# Patient Record
Sex: Male | Born: 1955 | Race: White | Hispanic: No | Marital: Married | Smoking: Never smoker
Health system: Southern US, Community
[De-identification: ages and names within clinical notes are randomized; demographics above are authoritative.]

## PROBLEM LIST (undated history)

## (undated) DIAGNOSIS — C801 Malignant (primary) neoplasm, unspecified: Secondary | ICD-10-CM

## (undated) HISTORY — PX: PROSTATE SURGERY: SHX751

## (undated) HISTORY — PX: JOINT REPLACEMENT: SHX530

---

## 2006-05-08 ENCOUNTER — Ambulatory Visit: Payer: Self-pay | Admitting: Gastroenterology

## 2007-03-18 DIAGNOSIS — C4491 Basal cell carcinoma of skin, unspecified: Secondary | ICD-10-CM

## 2007-03-18 HISTORY — DX: Basal cell carcinoma of skin, unspecified: C44.91

## 2008-02-04 DIAGNOSIS — D239 Other benign neoplasm of skin, unspecified: Secondary | ICD-10-CM

## 2008-02-04 HISTORY — DX: Other benign neoplasm of skin, unspecified: D23.9

## 2008-03-03 ENCOUNTER — Ambulatory Visit: Payer: Self-pay | Admitting: Internal Medicine

## 2009-05-12 ENCOUNTER — Ambulatory Visit: Payer: Self-pay | Admitting: Internal Medicine

## 2011-07-10 ENCOUNTER — Ambulatory Visit: Payer: Self-pay | Admitting: Specialist

## 2011-07-10 LAB — BASIC METABOLIC PANEL
BUN: 10 mg/dL (ref 7–18)
Chloride: 108 mmol/L — ABNORMAL HIGH (ref 98–107)
EGFR (African American): >60
EGFR (Non-African Amer.): >60
Osmolality: 280 (ref 275–301)

## 2011-07-10 LAB — URINALYSIS, COMPLETE
Bacteria: NONE SEEN
Bilirubin,UR: NEGATIVE
Glucose,UR: NEGATIVE mg/dL (ref 0–75)
Ketone: NEGATIVE
Leukocyte Esterase: NEGATIVE
Ph: 7 (ref 4.5–8.0)
Protein: NEGATIVE
Specific Gravity: 1.005 (ref 1.003–1.030)
Squamous Epithelial: NONE SEEN
WBC UR: NONE SEEN /HPF (ref 0–5)

## 2011-07-10 LAB — CBC
HCT: 41.3 % (ref 40.0–52.0)
MCHC: 32.9 g/dL (ref 32.0–36.0)
Platelet: 369 10*3/uL (ref 150–440)
RBC: 4.37 10*6/uL — ABNORMAL LOW (ref 4.40–5.90)
RDW: 12.6 % (ref 11.5–14.5)
WBC: 7.4 10*3/uL (ref 3.8–10.6)

## 2011-07-10 LAB — MRSA PCR SCREENING

## 2011-07-10 LAB — PROTIME-INR: INR: 1

## 2011-07-17 ENCOUNTER — Ambulatory Visit: Payer: Self-pay | Admitting: General Surgery

## 2011-07-18 ENCOUNTER — Ambulatory Visit: Payer: Self-pay | Admitting: General Surgery

## 2011-07-23 LAB — WOUND CULTURE

## 2011-10-02 ENCOUNTER — Ambulatory Visit: Payer: Self-pay | Admitting: Specialist

## 2011-10-02 LAB — BASIC METABOLIC PANEL
BUN: 11 mg/dL (ref 7–18)
Chloride: 109 mmol/L — ABNORMAL HIGH (ref 98–107)
Creatinine: 0.83 mg/dL (ref 0.60–1.30)
EGFR (African American): >60
EGFR (Non-African Amer.): >60
Glucose: 70 mg/dL (ref 65–99)
Sodium: 143 mmol/L (ref 136–145)

## 2011-10-02 LAB — URINALYSIS, COMPLETE
Bacteria: NONE SEEN
Bilirubin,UR: NEGATIVE
Blood: NEGATIVE
Glucose,UR: NEGATIVE mg/dL (ref 0–75)
Ketone: NEGATIVE
Leukocyte Esterase: NEGATIVE
Nitrite: NEGATIVE
Ph: 5 (ref 4.5–8.0)
Protein: NEGATIVE
RBC,UR: <1 /HPF (ref 0–5)
Specific Gravity: 1.019 (ref 1.003–1.030)
Squamous Epithelial: NONE SEEN
WBC UR: 1 /HPF (ref 0–5)

## 2011-10-02 LAB — APTT: Activated PTT: 29.4 secs (ref 23.6–35.9)

## 2011-10-02 LAB — CBC
HCT: 39 % — ABNORMAL LOW (ref 40.0–52.0)
HGB: 13.1 g/dL (ref 13.0–18.0)
MCH: 30.9 pg (ref 26.0–34.0)
MCHC: 33.5 g/dL (ref 32.0–36.0)
MCV: 92 fL (ref 80–100)
Platelet: 296 10*3/uL (ref 150–440)
RBC: 4.23 10*6/uL — ABNORMAL LOW (ref 4.40–5.90)
RDW: 13.9 % (ref 11.5–14.5)
WBC: 5.4 10*3/uL (ref 3.8–10.6)

## 2011-10-02 LAB — PROTIME-INR
INR: 0.9
Prothrombin Time: 13 secs (ref 11.5–14.7)

## 2011-10-02 LAB — MRSA PCR SCREENING

## 2011-10-10 ENCOUNTER — Inpatient Hospital Stay: Payer: Self-pay | Admitting: Specialist

## 2011-10-10 LAB — TROPONIN I: Troponin-I: <0.02 ng/mL

## 2011-10-10 LAB — MAGNESIUM: Magnesium: 1.8 mg/dL

## 2011-10-10 LAB — BASIC METABOLIC PANEL
BUN: 10 mg/dL (ref 7–18)
Calcium, Total: 7.8 mg/dL — ABNORMAL LOW (ref 8.5–10.1)
Chloride: 108 mmol/L — ABNORMAL HIGH (ref 98–107)
Co2: 28 mmol/L (ref 21–32)
EGFR (Non-African Amer.): >60
Glucose: 213 mg/dL — ABNORMAL HIGH (ref 65–99)
Osmolality: 287 (ref 275–301)
Potassium: 3.9 mmol/L (ref 3.5–5.1)
Sodium: 141 mmol/L (ref 136–145)

## 2011-10-10 LAB — CBC WITH DIFFERENTIAL/PLATELET
Basophil %: 0.4 %
Eosinophil #: 0 10*3/uL (ref 0.0–0.7)
Eosinophil %: 0.2 %
HCT: 33.7 % — ABNORMAL LOW (ref 40.0–52.0)
HGB: 11.5 g/dL — ABNORMAL LOW (ref 13.0–18.0)
Lymphocyte #: 0.9 10*3/uL — ABNORMAL LOW (ref 1.0–3.6)
MCH: 31.3 pg (ref 26.0–34.0)
MCHC: 34.1 g/dL (ref 32.0–36.0)
Monocyte #: 0.5 x10 3/mm (ref 0.2–1.0)
Neutrophil #: 13.6 10*3/uL — ABNORMAL HIGH (ref 1.4–6.5)
RBC: 3.66 10*6/uL — ABNORMAL LOW (ref 4.40–5.90)
WBC: 15.1 10*3/uL — ABNORMAL HIGH (ref 3.8–10.6)

## 2011-10-10 LAB — CK TOTAL AND CKMB (NOT AT ARMC)
CK, Total: 2408 U/L — ABNORMAL HIGH (ref 35–232)
CK-MB: 50.2 ng/mL — ABNORMAL HIGH (ref 0.5–3.6)
CK-MB: 32.7 ng/mL — ABNORMAL HIGH (ref 0.5–3.6)

## 2011-10-11 LAB — CBC WITH DIFFERENTIAL/PLATELET
Basophil #: 0 10*3/uL (ref 0.0–0.1)
Basophil %: 0.4 %
Eosinophil %: 0.4 %
HCT: 32.9 % — ABNORMAL LOW (ref 40.0–52.0)
HGB: 11.5 g/dL — ABNORMAL LOW (ref 13.0–18.0)
Lymphocyte #: 0.6 10*3/uL — ABNORMAL LOW (ref 1.0–3.6)
MCV: 91 fL (ref 80–100)
Monocyte #: 0.7 x10 3/mm (ref 0.2–1.0)
Monocyte %: 6.8 %
Neutrophil #: 9 10*3/uL — ABNORMAL HIGH (ref 1.4–6.5)
RDW: 13.8 % (ref 11.5–14.5)
WBC: 10.4 10*3/uL (ref 3.8–10.6)

## 2011-10-11 LAB — BASIC METABOLIC PANEL
Calcium, Total: 8.1 mg/dL — ABNORMAL LOW (ref 8.5–10.1)
Chloride: 107 mmol/L (ref 98–107)
Co2: 25 mmol/L (ref 21–32)
Creatinine: 0.63 mg/dL (ref 0.60–1.30)
EGFR (African American): >60
Glucose: 138 mg/dL — ABNORMAL HIGH (ref 65–99)
Potassium: 3.8 mmol/L (ref 3.5–5.1)
Sodium: 140 mmol/L (ref 136–145)

## 2011-10-11 LAB — CK TOTAL AND CKMB (NOT AT ARMC)
CK, Total: 1144 U/L — ABNORMAL HIGH (ref 35–232)
CK-MB: 14.1 ng/mL — ABNORMAL HIGH (ref 0.5–3.6)

## 2011-10-11 LAB — TROPONIN I: Troponin-I: <0.02 ng/mL

## 2011-10-12 LAB — BASIC METABOLIC PANEL
Anion Gap: 5 — ABNORMAL LOW (ref 7–16)
BUN: 10 mg/dL (ref 7–18)
EGFR (Non-African Amer.): >60
Glucose: 103 mg/dL — ABNORMAL HIGH (ref 65–99)
Potassium: 4.3 mmol/L (ref 3.5–5.1)
Sodium: 141 mmol/L (ref 136–145)

## 2011-10-14 LAB — BASIC METABOLIC PANEL
Anion Gap: 9 (ref 7–16)
BUN: 7 mg/dL (ref 7–18)
Calcium, Total: 8.6 mg/dL (ref 8.5–10.1)
Chloride: 110 mmol/L — ABNORMAL HIGH (ref 98–107)
Co2: 26 mmol/L (ref 21–32)
EGFR (Non-African Amer.): >60
Glucose: 88 mg/dL (ref 65–99)
Osmolality: 286 (ref 275–301)
Potassium: 4 mmol/L (ref 3.5–5.1)

## 2011-10-14 LAB — HEMOGLOBIN: HGB: 9.8 g/dL — ABNORMAL LOW (ref 13.0–18.0)

## 2012-10-08 ENCOUNTER — Ambulatory Visit: Payer: Self-pay | Admitting: Specialist

## 2012-10-08 DIAGNOSIS — R9431 Abnormal electrocardiogram [ECG] [EKG]: Secondary | ICD-10-CM

## 2012-10-08 LAB — URINALYSIS, COMPLETE
Bilirubin,UR: NEGATIVE
Glucose,UR: NEGATIVE mg/dL (ref 0–75)
Ketone: NEGATIVE
Leukocyte Esterase: NEGATIVE
Ph: 5 (ref 4.5–8.0)
Protein: NEGATIVE
RBC,UR: 2 /HPF (ref 0–5)
Specific Gravity: 1.024 (ref 1.003–1.030)
WBC UR: <1 /HPF (ref 0–5)

## 2012-10-08 LAB — MRSA PCR SCREENING

## 2012-10-08 LAB — BASIC METABOLIC PANEL
Anion Gap: 6 — ABNORMAL LOW (ref 7–16)
BUN: 15 mg/dL (ref 7–18)
Calcium, Total: 9.3 mg/dL (ref 8.5–10.1)
Chloride: 108 mmol/L — ABNORMAL HIGH (ref 98–107)
Creatinine: 0.85 mg/dL (ref 0.60–1.30)
EGFR (Non-African Amer.): >60
Osmolality: 281 (ref 275–301)
Potassium: 4.1 mmol/L (ref 3.5–5.1)

## 2012-10-08 LAB — APTT: Activated PTT: 31.4 secs (ref 23.6–35.9)

## 2012-10-08 LAB — CBC
HGB: 14.3 g/dL (ref 13.0–18.0)
MCHC: 34.8 g/dL (ref 32.0–36.0)
MCV: 91 fL (ref 80–100)
RBC: 4.53 10*6/uL (ref 4.40–5.90)

## 2012-10-08 LAB — PROTIME-INR: Prothrombin Time: 13.7 secs (ref 11.5–14.7)

## 2012-10-30 ENCOUNTER — Inpatient Hospital Stay: Payer: Self-pay | Admitting: Specialist

## 2012-10-31 LAB — CREATININE, SERUM
Creatinine: 0.89 mg/dL (ref 0.60–1.30)
EGFR (African American): >60
EGFR (Non-African Amer.): >60

## 2012-10-31 LAB — HEMOGLOBIN: HGB: 11.5 g/dL — ABNORMAL LOW (ref 13.0–18.0)

## 2012-11-01 LAB — PATHOLOGY REPORT

## 2012-12-09 ENCOUNTER — Ambulatory Visit: Payer: Self-pay | Admitting: Specialist

## 2013-10-22 IMAGING — CT CT HEAD WITHOUT CONTRAST
1 series · 16 of 30 positions shown, 20 images · non-contrast
Comparison: none

REASON FOR EXAM: possible sz
COMMENTS:

PROCEDURE:     CT  - CT HEAD WITHOUT CONTRAST  - October 10, 2011  [DATE]
RESULT:     Comparison:  None
TECHNIQUE: Multiple axial images from the foramen magnum to the vertex were
obtained without IV contrast.

[Series 2: soft tissue · axial · 0.41mm/px · z∈[+866,+1000]mm · 16 of 31 slices shown, 20 images]
[im 2/31  brain]
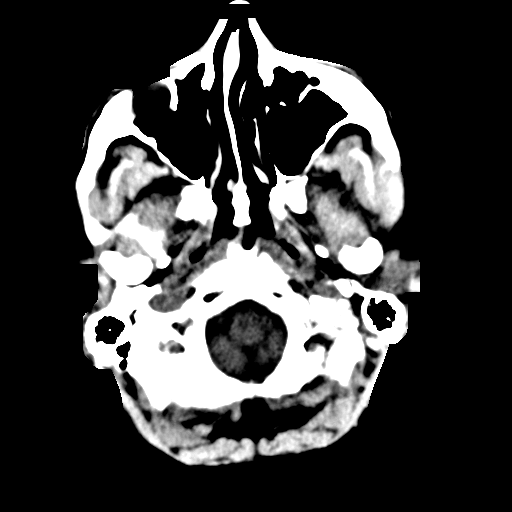
[im 2/31  bone]
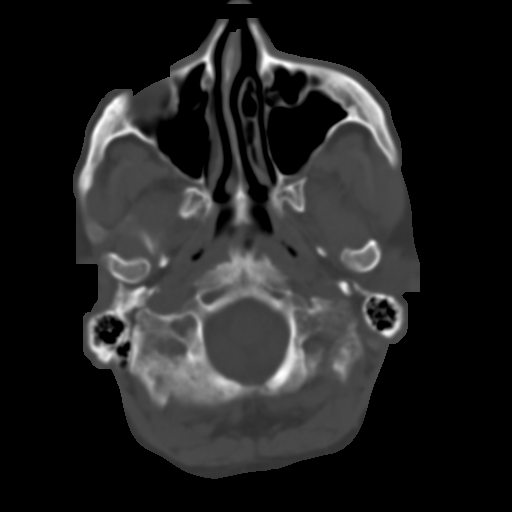
[im 4/31  brain]
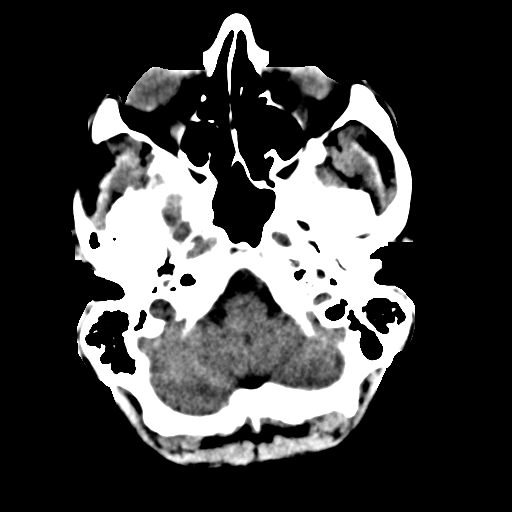
[im 6/31  brain]
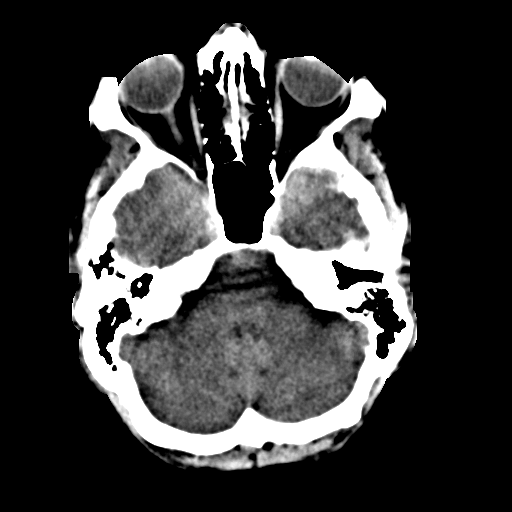
[im 8/31  brain]
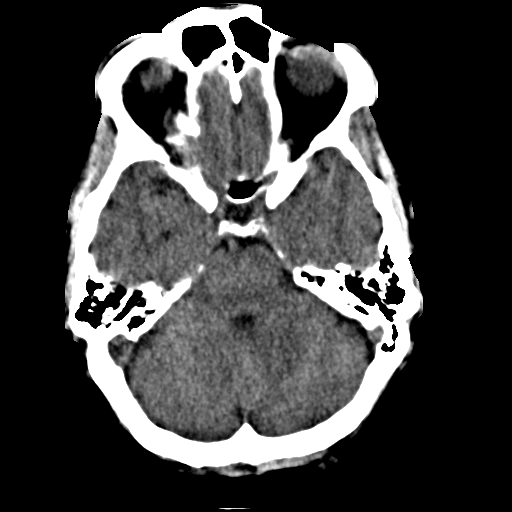
[im 9/31  brain]
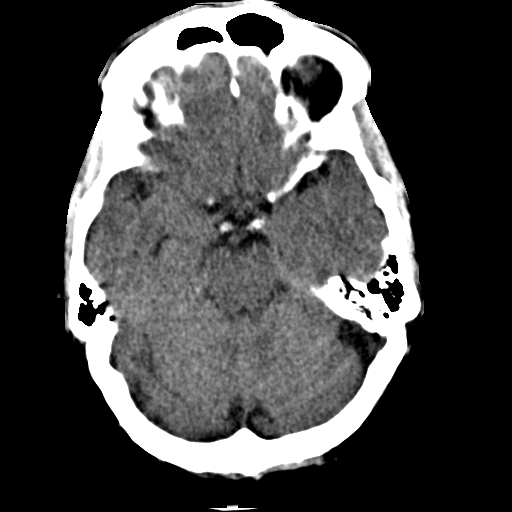
[im 9/31  bone]
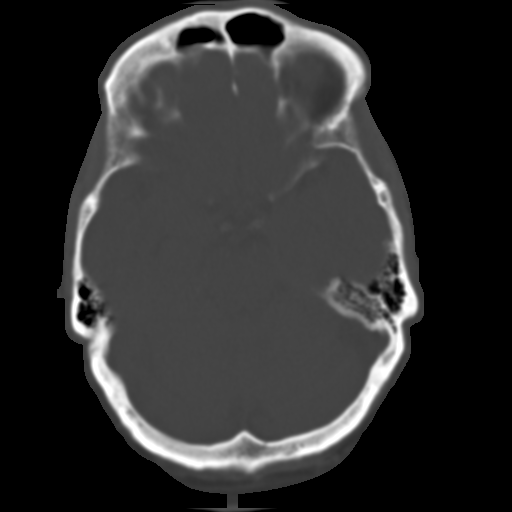
[im 11/31  brain]
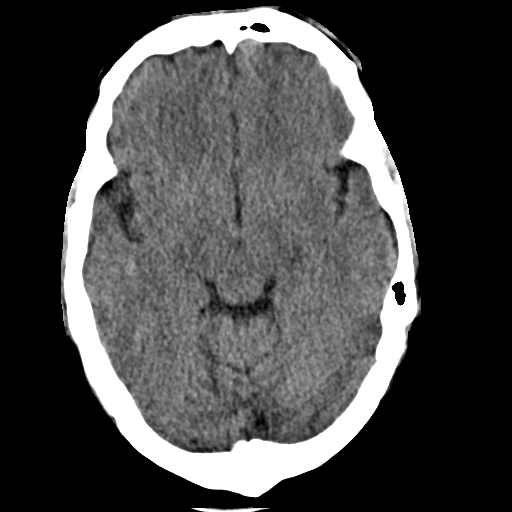
[im 13/31  brain]
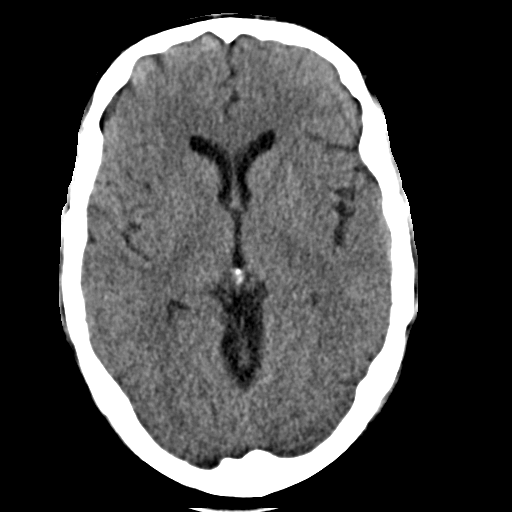
[im 15/31  brain]
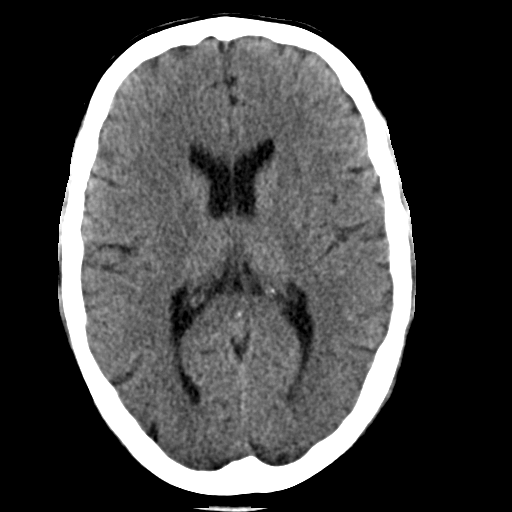
[im 16/31  brain]
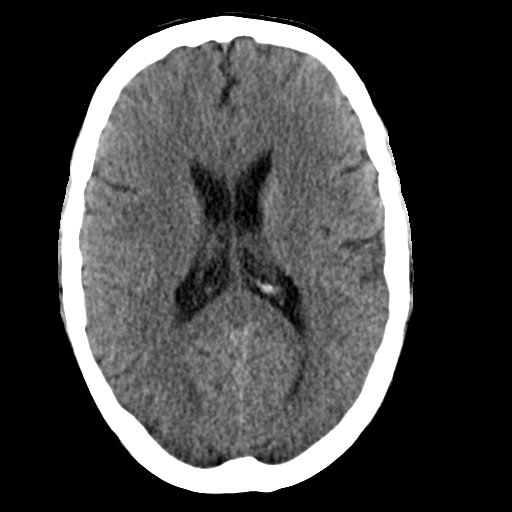
[im 16/31  bone]
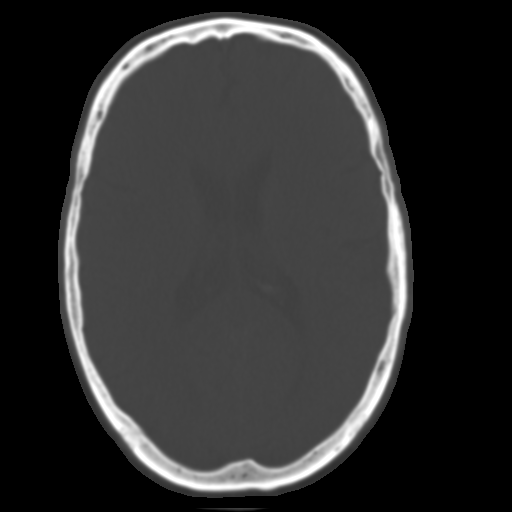
[im 18/31  brain]
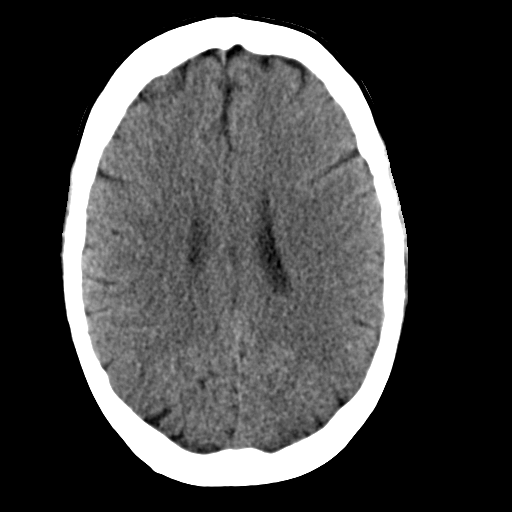
[im 20/31  brain]
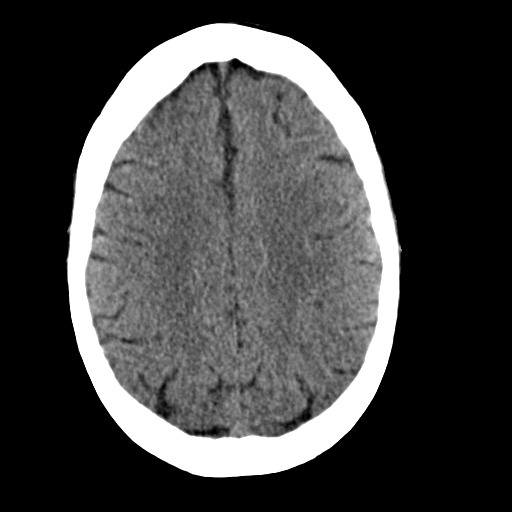
[im 22/31  brain]
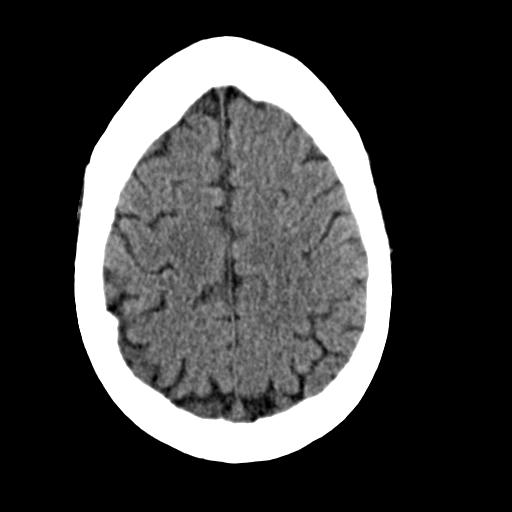
[im 23/31  brain]
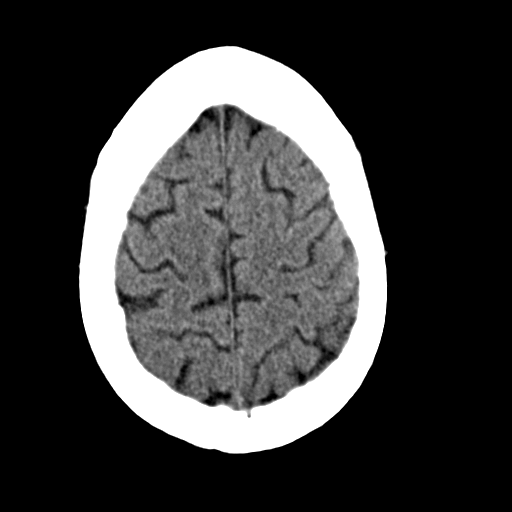
[im 23/31  bone]
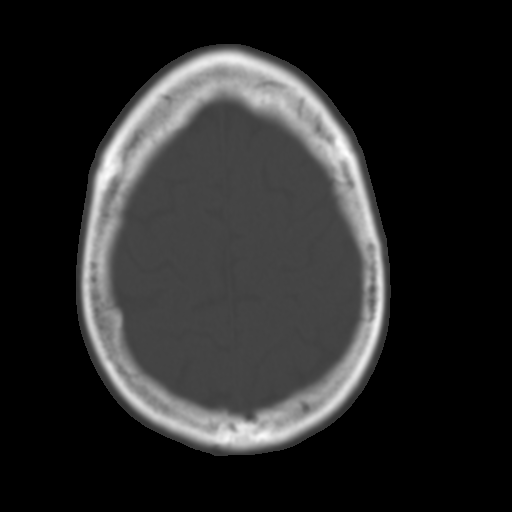
[im 25/31  brain]
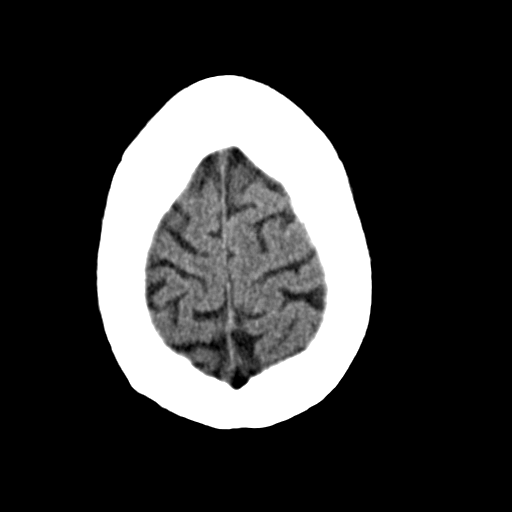
[im 27/31  brain]
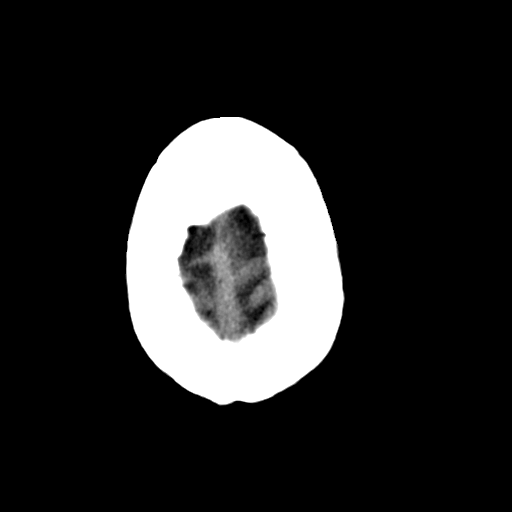
[im 29/31  brain]
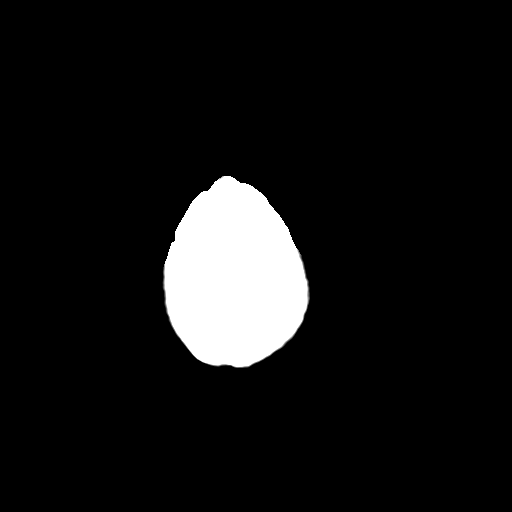

[16 of 30 positions shown; findings below may reference images not displayed]

FINDINGS: There is no evidence for mass effect, midline shift, or extra-axial fluid
collections. There is no evidence for space-occupying lesion, intracranial
hemorrhage, or cortical-based area of infarction.

The osseous structures are unremarkable.
IMPRESSION: No acute intracranial process.

## 2014-06-30 NOTE — Consult Note (Signed)
PATIENT NAME:  Dean, Gonzales MR#:  [id number] DATE OF BIRTH:  [birth year]  DATE OF CONSULTATION:  10/10/2011  REFERRING PHYSICIAN:  Dr. Christophe Louis  CONSULTING PHYSICIAN:  Dajuan Turnley H. Posey Pronto, MD  REASON FOR CONSULTATION: Decrease in responsiveness, hypotension, bradycardia.   HISTORY OF PRESENT ILLNESS: Patient is a 59 year old Caucasian male who reports that he has a history of having a syncopal episode since childhood. Occurs especially when he is told about medically related things. His last episode was when somebody was talking to him about his ankle fracture where he passed out. Patient earlier today underwent a left total hemiarthroplasty in the Operating Room. Patient did have estimated blood loss of 450 mL. Patient was transferred from PAC-U to 1A in a stable condition. While on 1A the patient started becoming very pale. He became poorly responsive and subsequently according to nurse had some jerking episode of his upper extremity. Patient subsequently after the episode was a little confused, lasted for a minute or so and then started coming around. By the time I saw him he was noted to be hypotensive and he was appearing pale. After he was placed in the Trendelenburg position and IV fluids were increased patient started feeling much better. Patient currently denies any headaches. No asymmetrical weakness. No chest pain. Was not short of breath. Denies any abdominal pain, nausea, vomiting. He has never had any history of seizures. He is only a social drinker.   PAST MEDICAL HISTORY: He does not have any medical history according to him.   PAST SURGICAL HISTORY:  1. Status post exploratory abdominal rectus surgery. 2. Status post tonsillectomy.   HOME MEDICATIONS:  1. Multivitamin with minerals 1 tab p.o. daily.  2. Glucosamine with chondroitin 2 tabs daily.  3. Extra strength fish oil 2 tabs daily.  4. Calcium plus vitamin D 1 tab p.o. daily.   ALLERGIES: None.   SOCIAL HISTORY:  Does not smoke. Drinks socially. No drug use.   FAMILY HISTORY: Positive for hypertension.   REVIEW OF SYSTEMS: CONSTITUTIONAL: Denies any fevers. Complains of fatigue, weakness right now. No significant pain currently. No weight loss. No weight gain. EYES: No blurred or double vision. No redness. No inflammation. No glaucoma. ENT: No tinnitus. No ear pain. No hearing loss. No seasonal or year-round allergies. RESPIRATORY: No cough. No wheezing. No hemoptysis. No chronic obstructive pulmonary disease. No tuberculosis. CARDIOVASCULAR: No chest pain. No orthopnea. No edema. No arrhythmia. GASTROINTESTINAL: No nausea, vomiting, diarrhea. No abdominal pain. No hematemesis. No melena. GENITOURINARY: Denies any dysuria, hematuria, renal calculus or frequency. ENDO: Denies any polyuria, nocturia, or thyroid problems. HEME/LYMPH: Denies anemia, easy bruisability, or bleeding. SKIN: No acne. No rash. No changes in mole, hair or skin. MUSCULOSKELETAL: Has pain for which he underwent his surgery. No gout. NEUROLOGIC: No numbness. No cerebrovascular accident. No transient ischemic attack. No seizures. PSYCHIATRIC: No anxiety. No insomnia. No ADD. No OCD.   PHYSICAL EXAMINATION:  VITAL SIGNS: Temperature 97.3, pulse 58, respirations 20, blood pressure at the time when I saw him 95/61, O2 100%.   GENERAL: Patient is a well developed, well nourished male, appears a little pale. Currently not in any acute distress.   HEENT: Head atraumatic, normocephalic. Pupils equally round, reactive to light and accommodation. There is conjunctival pallor. No scleral icterus. Extraocular movements intact. Nasal exam shows no drainage or ulceration. Oropharynx is clear without any evidence of tongue biting or bleeding.   NECK: No thyromegaly. No carotid bruits.   CARDIOVASCULAR: Regular rate and  rhythm. No murmurs, rubs, clicks, or gallops. PMI is not displaced.   LUNGS: Clear to auscultation bilaterally without any rales,  rhonchi, wheezing.   ABDOMEN: Soft, nontender, nondistended. Positive bowel sounds x4.   EXTREMITIES: No clubbing, cyanosis, edema.   SKIN: No rash.   LYMPHATICS: No lymph nodes palpable.   VASCULAR: Good DP, PT pulses.   PSYCHIATRIC: Not anxious, depressed.   NEUROLOGICAL: Awake, alert, oriented x3. No focal deficits. Cranial nerves II through XII grossly intact.   LABORATORY, DIAGNOSTIC AND RADIOLOGICAL DATA: EKG shows sinus bradycardia without any ST-T wave. Blood glucose 118.   ASSESSMENT AND PLAN: Patient is a 58 year old male with history of osteoarthritis of the hip status post left hip arthroplasty with 450 mL of blood loss. Patient has bradycardia chronically, low borderline blood pressure who was doing okay then according to nurse became poorly responsive and pale. There was jerking of upper extremity. Patient had a brief episode decrease in responsiveness.  1. Decrease in responsiveness likely due to hypotension combined with likely acute blood loss anemia. Other differentials including possible seizures. At this time I will go ahead and increase his IV fluids. Patient has been getting his own blood back. Will check electrolytes including CBC, magnesium, BMP STAT. Place him on tele. Get echocardiogram of the heart. Will check a CT scan of the head. If he has any further episodes will need EEG and neurological evaluation. Doubt that this is seizure related.  2. Hypotension, likely due to acute blood loss. Will give him IV fluids.  3. Acute blood loss anemia. Patient getting his own blood back. Will get a STAT CBC.  4. Miscellaneous. Continue incentive spirometry. DVT prophylaxis as per ortho.   TIME SPENT: 35 minutes spent.   ____________________________ Lafonda Mosses. Posey Pronto, MD shp:cms D: 10/10/2011 13:52:57 ET T: 10/10/2011 15:01:42 ET  JOB#: [id number] cc: Mordecai Tindol H. Posey Pronto, MD, <Dictator> Alric Seton MD ELECTRONICALLY SIGNED 10/19/2011 13:38

## 2014-06-30 NOTE — Op Note (Signed)
PATIENT NAME:  Dean Gonzales, Dean Gonzales MR#:  [id number] DATE OF BIRTH:  1956-01-02  DATE OF PROCEDURE:  10/10/2011  PREOPERATIVE DIAGNOSIS: Severe degenerative arthritis, left hip.   POSTOPERATIVE DIAGNOSIS: Severe degenerative arthritis, left hip.   PROCEDURE: AML left total hip arthroplasty.   SURGEON: Christophe Louis, M.D.   ASSISTANT: Earnestine Leys, M.D.   ANESTHESIA: Spinal.   COMPLICATIONS: None.   ESTIMATED BLOOD LOSS: 450 mL.   DRAINS: Two Autovac drains.   DESCRIPTION OF PROCEDURE: One gram of Ancef was given intravenously prior to the procedure and then also again just prior to wound closure. Spinal anesthesia is induced and the patient is placed in the right lateral decubitus position in the usual position for left hip arthroplasty. A standard posterolateral incision is made and the dissection carried down to the fascia lata which is incised in the line of the skin incision. The sciatic nerve is palpated deep within the wound and is protected throughout the case. Trochanteric bursa is incised. The piriformis tendon is identified and cut off at its attachment on the femur and tagged. The remainder of the external rotators are cut with the coagulation Bovie. The capsule is completely dissected out using the periosteal elevator. The capsule is then cut in a T fashion and the ends tagged. The hip is then easily dislocated. The femoral head and neck is cut off at the appropriate angle and length. Hohmann retractors are placed to reveal the acetabulum and all soft tissue debris is removed including the labrum with preservation of the capsule. The acetabulum is then serially reamed up to 55 mm and then a 56 mm cup is impacted into place as a trial and is seen to be an excellent fit. The acetabulum is thoroughly irrigated multiple times. The trispiked porous-coated AML acetabulum is then impacted into place in the appropriate alignment and is seen to be a good fit. The trial liner is screwed into  place with the 4 mm build-up posterolaterally. The proximal femur is then prepared in the usual fashion and reamed up to a 17.5 mm in diameter and the 18 mm small broach is impacted into place and is seen to be a good fit. The 5 mm length neck and ball are then put into position and the hip is reduced and is seen to show excellent leg alignment with excellent stability. The hip is then dislocated and all trial components are removed. The joint is thoroughly irrigated multiple times with the pulsatile lavage. Hole eliminator is placed. A 56 mm trial liner is impacted into place with the build-up posterolaterally. The 18 mm in diameter AML porous-coated stem is then impacted into place and is seen to be an excellent fit. The +5 mm ball is impacted over the trunnion and the hip is reduced and is seen to be stable with equal leg length. The joint is thoroughly irrigated multiple times. The capsule is repaired and then secured to the abductor mechanism using multiple Tycron sutures. The piriformis is reattached to the abductor mechanism. Two Autovac drains are brought out through separate stab wound incisions. The fascia lata is closed with Tycron sutures and the subcutaneous tissue is closed with 2-0 Vicryl. The skin is closed with the skin stapler. A soft bulky dressing is applied. The patient is placed over onto the hospital bed and placed in an abduction pillow and is returned to the recovery room in satisfactory condition having tolerated the procedure quite well.  ____________________________ Lucas Mallow, MD ces:slb D: 10/10/2011 10:58:22  ET T: 10/10/2011 12:35:42 ET JOB#: [id number]  cc: Lucas Mallow, MD, <Dictator> Lucas Mallow MD ELECTRONICALLY SIGNED 10/11/2011 8:33

## 2014-06-30 NOTE — Discharge Summary (Signed)
PATIENT NAME:  Dean Gonzales, Dean Gonzales MR#:  [id number] DATE OF BIRTH:  05-12-1955  DATE OF ADMISSION:  10/10/2011 DATE OF DISCHARGE:  10/14/2011  DISCHARGE DIAGNOSIS: Severe degenerative arthritis, left hip.   OPERATIONS/PROCEDURES PERFORMED: Left total hip arthroplasty on 10/10/2011.   HISTORY AND PHYSICAL: History and physical examination is as written on admission.   LABORATORY, DIAGNOSTIC AND RADIOLOGICAL DATA: Laboratory data is as noted on the chart.   HOSPITAL COURSE: The patient was admitted and following thorough laboratory evaluation and preop medical clearance the patient was taken to the Operating Room on 10/10/2011 where left total hip arthroplasty was performed without difficulty. The patient tolerated the procedure extremely well. Postoperatively he did have a vasovagal reaction with bradycardia and lowered blood pressure. His lab results all were within normal limits. He was evaluated by [facility] and cardiology and no significant pathology was noted. He was treated with hydration and recovered from this and then was easily advanced in physical therapy with the usual total hip protocol, full weight-bearing using the walker. By 10/14/2011 he was doing quite well and he was discharged to his home with home health physical therapy. He may be full weight-bearing using the walker. Home health physical therapy has been arranged as well as home occupational therapy. He is to return to the office to see Dr. Tamala Julian in 10 days for staple removal, exam and x-ray.  ____________________________ Lucas Mallow, MD ces:cms D: 10/27/2011 08:33:28 ET T: 10/27/2011 10:37:48 ET JOB#: [id number]  cc: Lucas Mallow, MD, <Dictator>  Lucas Mallow MD ELECTRONICALLY SIGNED 11/02/2011 9:51

## 2014-07-03 NOTE — Discharge Summary (Signed)
PATIENT NAME:  Dean, Gonzales MR#:  [id number] DATE OF BIRTH:  02-23-1956  DATE OF ADMISSION:  10/30/2012 DATE OF DISCHARGE:  11/02/2012  DISCHARGE DIAGNOSES:  1.  Severe degenerative arthritis, right hip.  2.  Previous radical prostatectomy.  3.  Previous left total hip replacement.  OPERATIONS OR PROCEDURES PERFORMED: Right total hip replacement on 10/30/2012.   HISTORY AND PHYSICAL EXAMINATION: As written on admission.   LABORATORY DATA: As noted in the chart.    HOSPITAL COURSE: The patient was taken to the operating room on 10/30/2012 where right total hip replacement was performed with DePuy AML components. The patient tolerated the procedure extremely well and had a benign postoperative course. He was advanced up into the chair on the first postoperative day with ambulation as tolerated. He was placed on Lovenox and TED stockings as prophylaxis for venous thrombosis and pulmonary embolism. He was advanced in physical therapy to full weight-bearing using the walker. He tolerated physical therapy quite well. His diet was advanced. He was discharged on 11/02/2012 to resume taking his present medications at home and also home health physical therapy was arranged. He was placed on aspirin 325 mg twice a day as prophylaxis for venous thrombosis and pulmonary embolism and also was given a prescription for hydrocodone 1 tablet every 3 hours as necessary for pain. He is to return to the office in 10 days see Dr. Tamala Julian for exam and x-ray.   ____________________________ Lucas Mallow, MD ces:aw D: 11/18/2012 16:35:21 ET T: 11/18/2012 16:44:05 ET JOB#: [id number]  cc: Lucas Mallow, MD, <Dictator> Lucas Mallow MD ELECTRONICALLY SIGNED 11/22/2012 6:25

## 2014-07-03 NOTE — Op Note (Signed)
PATIENT NAME:  Dean Gonzales, Dean Gonzales MR#:  [id number] DATE OF BIRTH:  14-Aug-1955  DATE OF PROCEDURE:  10/30/2012  PREOPERATIVE DIAGNOSIS:  Severe degenerative arthritis, right hip.  POSTOPERATIVE DIAGNOSIS:  Severe degenerative arthritis, right hip.  PROCEDURE:  Right total hip arthroplasty with DePuy AML components.   SURGEON: Christophe Louis, MD.   ASSISTANT:  Earnestine Leys, MD.   ANESTHESIA:  General.   COMPLICATIONS:  None.   DRAINS:  None.   ESTIMATED BLOOD LOSS:  300 mL   DESCRIPTION OF PROCEDURE: Two grams of Ancef was given intravenously prior to the procedure. General anesthesia was induced. The patient is turned into the left lateral decubitus position in the usual manner for right hip surgery. The patient was secured on the table with the hip grips. The right hip and leg are thoroughly prepped with alcohol and ChloraPrep and draped in standard sterile fashion. A standard posterolateral incision is made and the dissection carried down to the fascia lata which was incised in the line of the skin incision. Retractors were placed. The sciatic nerve is palpated deep in the wound posteriorly and protected throughout the case. Trochanteric bursa is excised. Piriformis tendon is identified and cut off of the femur and tagged. The remainder of the external rotators is then cut. The capsule is completely dissected out with a periosteal elevator and then cut in a T fashion and the ends tagged. The hip is then easily dislocated. The femoral head and neck is cut off at the appropriate angle and length. Retractor is placed, and the acetabulum is cleared of all soft tissue debris. The acetabulum is serially reamed up to a 53 mm in diameter size. Acetabulum is thoroughly irrigated multiple times. The 54 mm acetabular trial is impacted into place and is seen to be in excellent fit. This is removed. The acetabulum is thoroughly irrigated multiple times again and then the tri-spike Pinnacle porous-coated  acetabular shell 54 mm in diameter is impacted into place and is seen to be a good fit. The 4 degree build-up trial acetabulum is then screwed into place. Proximal femur is then performed in the usual manner with reaming and broaching up to an 18 mm in diameter small stature component. Trial components using a 1.5 mm head is reduced and there is seen to be excellent leg lengths and alignment. All trial components are then removed. The wound is thoroughly irrigated multiple times with a pulsatile lavage. Hole eliminator is screwed into place. The Pinnacle [place] polyethylene acetabular liner with a 10 degree build-up placed posterolaterally is impacted into place. The 18 mm in diameter small stature AML femoral stem is then impacted into place and seen to be a good solid fit. The 1.5 metal femoral head is impacted over this and the hip is reduced and is seen to be stable with excellent alignment. The wound is thoroughly irrigated multiple times. The posterior capsule is repaired with #2 Tycron and then re-secured back to the abductor mechanism. Likewise, the piriformis tendon is re-secured back to the abductor mechanism. The fascia lata is closed with #2 Tycron. Subcutaneous tissue is closed with 2-0 Vicryl. The skin is closed with a skin stapler. A soft bulky dressing is applied. The patient is turned onto the hospital bed and secured in the abductor pillow and returned to the recovery room in satisfactory condition having tolerated the procedure quite well.    ____________________________ Lucas Mallow, MD ces:ce D: 10/30/2012 11:47:50 ET T: 10/30/2012 12:39:05 ET JOB#: [id number]  cc: Harrell Gave  Christain Sacramento, MD, <Dictator> Lucas Mallow MD ELECTRONICALLY SIGNED 11/06/2012 7:42

## 2014-07-05 NOTE — Op Note (Signed)
PATIENT NAME:  Dean Gonzales, TOBLER MR#:  [id number] DATE OF BIRTH:  Jan 16, 1956  DATE OF PROCEDURE:  07/18/2011  PREOPERATIVE DIAGNOSIS: Abdominal wall mass with suspected foreign body.   POSTOPERATIVE DIAGNOSIS: Abscess within the rectus sheath on the right with a foreign body present.   OPERATION PERFORMED: Exploration of right rectus sheath with drainage of abscess and removal of foreign body.   SURGEON: S.G. Jamal Collin, MD  ANESTHESIA: General.   COMPLICATIONS: None.   ESTIMATED BLOOD LOSS: Minimal.   DRAINS: Blake drain.   DESCRIPTION OF PROCEDURE: The patient was put to sleep in the supine position on the operating table. Patient had a painful mass that appeared below the level of the umbilicus and over the right rectus area within the last week. It had become progressively more painful with a little reddish hue on the skin. CT scan showed evidence of inflammatory process within the rectus sheath with a foreign body, likely a metallic object, within it. This object had been identified two years ago when he had an upper GI also. Patient had no recollection of any kind of injury or any potential explanation for presence of this foreign body in this region.   The abdomen was prepped and draped out as a sterile field. The mass was palpated extending from the midline towards the right side and accordingly a transverse incision was made overlying this area and carefully deepened through the subcutaneous tissue that appeared to be mildly inflamed. Further dissection was performed with cautery until the fascia was identified which was then opened along the same line to reveal the rectus muscle which was somewhat scarred and inflamed both at the same time. Along the medial edge of the rectus muscle an abscess was encountered containing 10 mL of thick, white pus and within the depth of this was the presence of a car dark long metallic fragment about 2 cm in length and about 2 to 3 mm in width. It was slightly  bent at one end. The exact nature of this object was not clearly identifiable at this time. The abscess area was irrigated out with about 300 mL of saline. A Blake drain was then positioned into this area and brought out through a stab incision inferolaterally from the main incision and fastened to the skin with a nylon stitch. The fascia overlying the muscle was closed with interrupted figure-of-eight stitches of 0 Prolene. Some of the muscle tissue that appeared abnormal was removed for pathologic evaluation. The subcutaneous tissue was irrigated out and then closed in layers with 3-0 Vicryl and the skin closed with subcuticular 4-0 Vicryl, covered with Dermabond. A small dressing was placed around the drain site. Patient subsequently was extubated and returned to recovery room in stable condition.   ____________________________ S.Robinette Haines, MD sgs:cms D: 07/18/2011 11:49:23 ET T: 07/18/2011 12:09:36 ET JOB#: [id number]  cc: S.G. Jamal Collin, MD, <Dictator> [facility] Robinette Haines MD ELECTRONICALLY SIGNED 07/19/2011 10:15

## 2014-11-12 IMAGING — CR PELVIS - 1-2 VIEW
1 series · 1 of 1 positions shown · non-contrast
Comparison: none

REASON FOR EXAM: right total hip
COMMENTS:

PROCEDURE:     DXR - DXR PELVIS AP ONLY  - October 30, 2012 [DATE]
RESULT:

[ap]
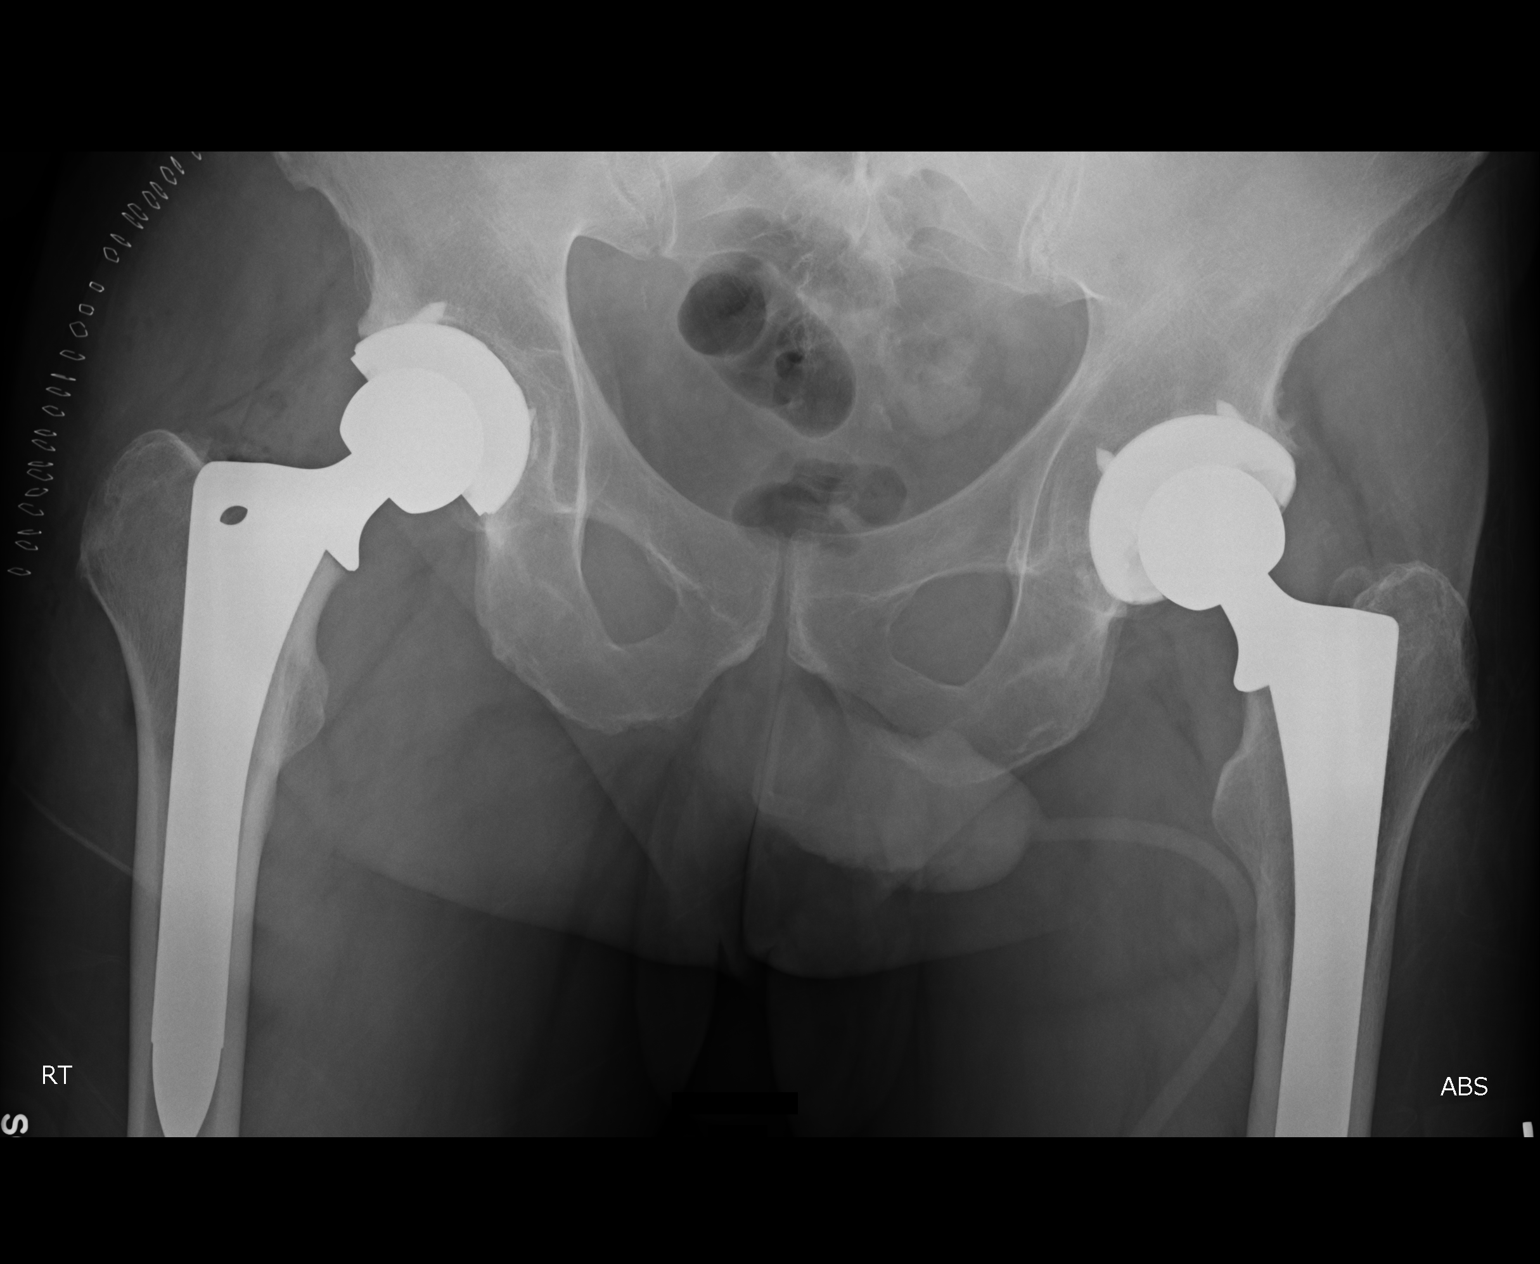

[1 of 1 positions shown; findings below may reference images not displayed]

FINDINGS: The patient is status post total right hip replacement. Skin
staples are identified about the right hip. The native osseous structures
are unremarkable.
IMPRESSION: 1. The patient is status post total right hip replacement. The remainder of
the interpretation will be left to the performing physician.

## 2014-11-12 IMAGING — CR RIGHT HIP - 1 VIEW
1 series · 1 of 1 positions shown · non-contrast
Comparison: none

REASON FOR EXAM: right total hip
COMMENTS:

PROCEDURE:     DXR - DXR HIP RIGHT ONE VIEW  - October 30, 2012 [DATE]
RESULT:

[lat]
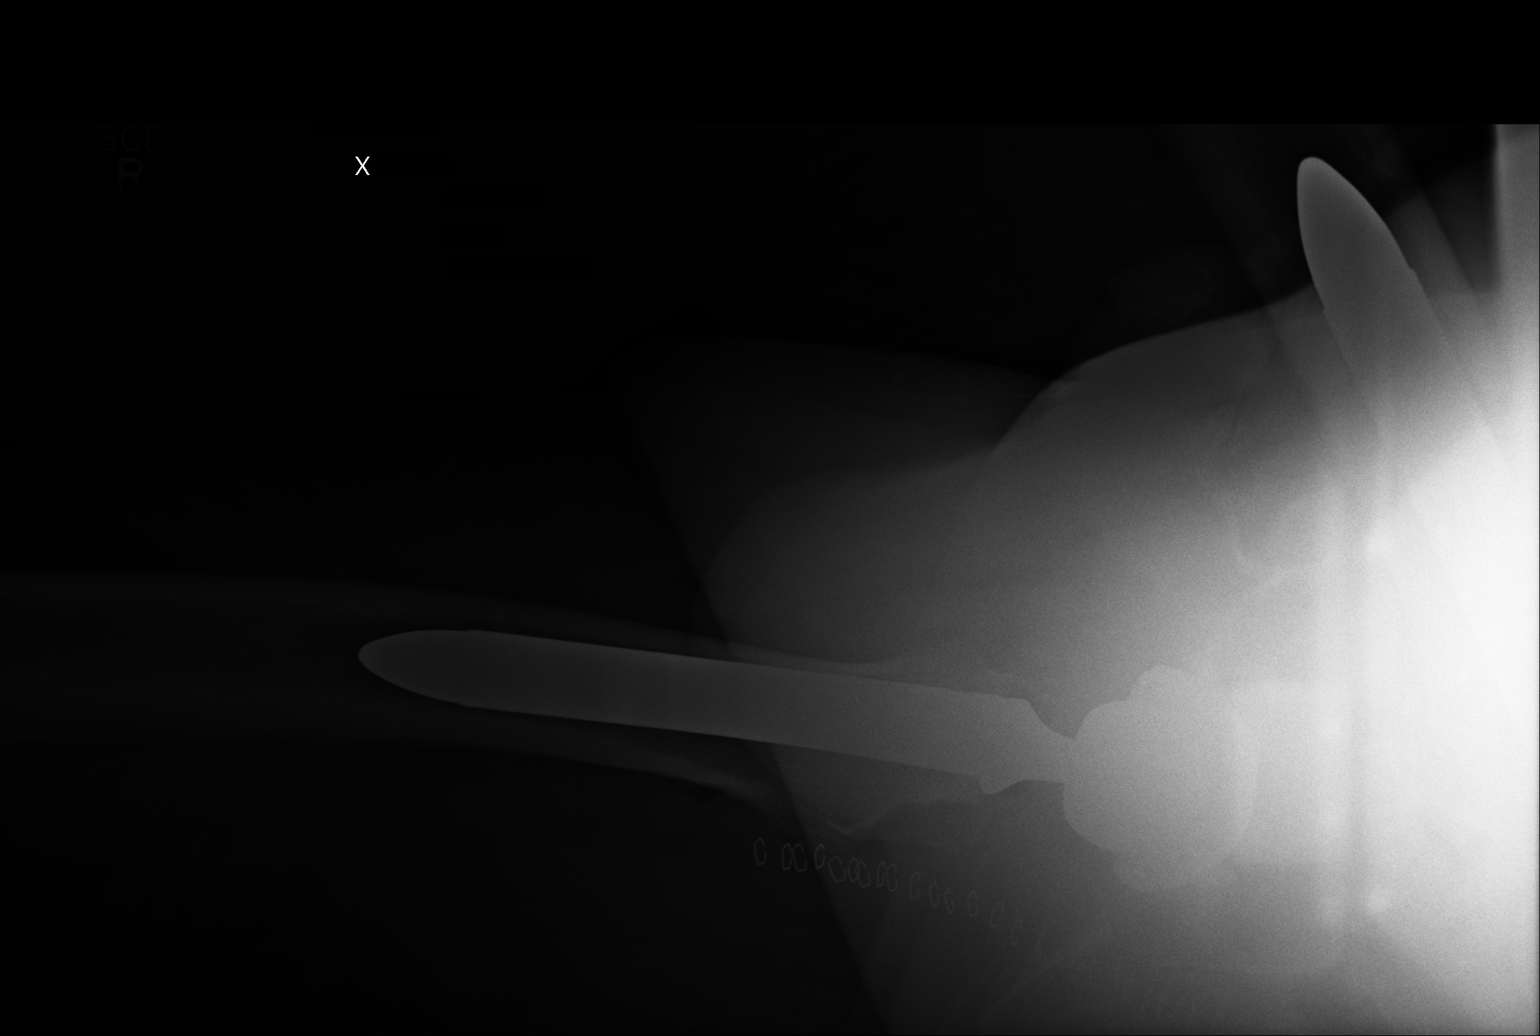

[1 of 1 positions shown; findings below may reference images not displayed]

FINDINGS: The patient is status post total right hip replacement. Hardware
appears intact. Evaluation is limited. Skin staples are identified about the
hip.
IMPRESSION: 1. Crosstable lateral evaluation of the right hip as described above. No
gross abnormalities are identified. The remainder of the interpretation will
be left to the ordering physician.

## 2016-12-26 ENCOUNTER — Encounter: Payer: Self-pay | Admitting: *Deleted

## 2016-12-27 ENCOUNTER — Ambulatory Visit: Payer: BLUE CROSS/BLUE SHIELD | Admitting: Anesthesiology

## 2016-12-27 ENCOUNTER — Ambulatory Visit
Admission: RE | Admit: 2016-12-27 | Discharge: 2016-12-27 | Disposition: A | Discharge status: Home / Self Care | Payer: BLUE CROSS/BLUE SHIELD | Source: Ambulatory Visit | Attending: Unknown Physician Specialty | Admitting: Unknown Physician Specialty

## 2016-12-27 ENCOUNTER — Encounter
Admission: RE | Disposition: A | Discharge status: Home / Self Care | Payer: Self-pay | Source: Ambulatory Visit | Attending: Unknown Physician Specialty

## 2016-12-27 ENCOUNTER — Encounter: Payer: Self-pay | Admitting: *Deleted

## 2016-12-27 DIAGNOSIS — Z8546 Personal history of malignant neoplasm of prostate: Secondary | ICD-10-CM | POA: Diagnosis not present

## 2016-12-27 DIAGNOSIS — K640 First degree hemorrhoids: Secondary | ICD-10-CM | POA: Insufficient documentation

## 2016-12-27 DIAGNOSIS — Z1211 Encounter for screening for malignant neoplasm of colon: Secondary | ICD-10-CM | POA: Insufficient documentation

## 2016-12-27 DIAGNOSIS — K5730 Diverticulosis of large intestine without perforation or abscess without bleeding: Secondary | ICD-10-CM | POA: Diagnosis not present

## 2016-12-27 HISTORY — DX: Malignant (primary) neoplasm, unspecified: C80.1

## 2016-12-27 HISTORY — PX: COLONOSCOPY WITH PROPOFOL: SHX5780

## 2016-12-27 SURGERY — COLONOSCOPY WITH PROPOFOL
Anesthesia: General

## 2016-12-27 MED ORDER — SODIUM CHLORIDE 0.9 % IV SOLN: INTRAVENOUS | Status: DC

## 2016-12-27 MED ORDER — LIDOCAINE HCL (CARDIAC) 20 MG/ML IV SOLN
INTRAVENOUS | Status: DC | PRN
  Administered 2016-12-27: 100 mg via INTRAVENOUS

## 2016-12-27 MED ORDER — PIPERACILLIN-TAZOBACTAM 3.375 G IVPB 30 MIN
3.3750 g | Freq: Once | INTRAVENOUS | Status: AC
  Administered 2016-12-27: 3.375 g via INTRAVENOUS
  Filled 2016-12-27: qty 50

## 2016-12-27 MED ORDER — PROPOFOL 10 MG/ML IV BOLUS
INTRAVENOUS | Status: DC | PRN
  Administered 2016-12-27: 70 mg via INTRAVENOUS
  Administered 2016-12-27: 100 mg via INTRAVENOUS

## 2016-12-27 MED ORDER — PROPOFOL 500 MG/50ML IV EMUL
INTRAVENOUS | Status: AC
  Filled 2016-12-27: qty 50

## 2016-12-27 MED ORDER — PIPERACILLIN-TAZOBACTAM 3.375 G IVPB
INTRAVENOUS | Status: AC
  Administered 2016-12-27: 3.375 g via INTRAVENOUS
  Filled 2016-12-27: qty 50

## 2016-12-27 MED ORDER — LIDOCAINE HCL (PF) 2 % IJ SOLN
INTRAMUSCULAR | Status: AC
  Filled 2016-12-27: qty 10

## 2016-12-27 MED ORDER — PROPOFOL 500 MG/50ML IV EMUL
INTRAVENOUS | Status: DC | PRN
  Administered 2016-12-27: 150 ug/kg/min via INTRAVENOUS

## 2016-12-27 MED ORDER — SODIUM CHLORIDE 0.9 % IV SOLN
INTRAVENOUS | Status: DC
  Administered 2016-12-27 (×2): via INTRAVENOUS

## 2016-12-27 NOTE — Anesthesia Postprocedure Evaluation (Signed)
Anesthesia Post Note  Patient: Dean Gonzales  Procedure(s) Performed: COLONOSCOPY WITH PROPOFOL (N/A )  Patient location during evaluation: Endoscopy Anesthesia Type: General Level of consciousness: awake and alert Pain management: pain level controlled Vital Signs Assessment: post-procedure vital signs reviewed and stable Respiratory status: spontaneous breathing, nonlabored ventilation, respiratory function stable and patient connected to nasal cannula oxygen Cardiovascular status: blood pressure returned to baseline and stable Postop Assessment: no apparent nausea or vomiting Anesthetic complications: no     Last Vitals:  Vitals:   12/27/16 1140 12/27/16 1150  BP: 114/84 114/84  Pulse: (!) 52 (!) 47  Resp: 13 16  Temp:    SpO2: 98% 100%    Last Pain:  Vitals:   12/27/16 1118  TempSrc: Tympanic                 Martha Clan

## 2016-12-27 NOTE — Anesthesia Preprocedure Evaluation (Signed)
Anesthesia Evaluation  Patient identified by MRN, date of birth, ID band Patient awake    Reviewed: Allergy & Precautions, H&P , NPO status , Patient's Chart, lab work & pertinent test results, reviewed documented beta blocker date and time   History of Anesthesia Complications Negative for: history of anesthetic complications  Airway Mallampati: I  TM Distance: >3 FB Neck ROM: full    Dental   Pulmonary neg pulmonary ROS,           Cardiovascular Exercise Tolerance: Good negative cardio ROS       Neuro/Psych negative neurological ROS  negative psych ROS   GI/Hepatic negative GI ROS, Neg liver ROS,   Endo/Other  negative endocrine ROS  Renal/GU negative Renal ROS  negative genitourinary   Musculoskeletal   Abdominal   Peds  Hematology negative hematology ROS (+)   Anesthesia Other Findings Past Medical History: No date: Cancer ([place])     Comment:  Prostate Cancer   Reproductive/Obstetrics negative OB ROS                             Anesthesia Physical Anesthesia Plan  ASA: I  Anesthesia Plan: General   Post-op Pain Management:    Induction: Intravenous  PONV Risk Score and Plan: 2 and Propofol infusion  Airway Management Planned: Nasal Cannula  Additional Equipment:   Intra-op Plan:   Post-operative Plan:   Informed Consent: I have reviewed the patients History and Physical, chart, labs and discussed the procedure including the risks, benefits and alternatives for the proposed anesthesia with the patient or authorized representative who has indicated his/her understanding and acceptance.   Dental Advisory Given  Plan Discussed with: Anesthesiologist, CRNA and Surgeon  Anesthesia Plan Comments:         Anesthesia Quick Evaluation

## 2016-12-27 NOTE — Anesthesia Post-op Follow-up Note (Signed)
Anesthesia QCDR form completed.        

## 2016-12-27 NOTE — Transfer of Care (Signed)
Immediate Anesthesia Transfer of Care Note  Patient: Dean Gonzales  Procedure(s) Performed: COLONOSCOPY WITH PROPOFOL (N/A )  Patient Location: Endoscopy Unit  Anesthesia Type:General  Level of Consciousness: sedated  Airway & Oxygen Therapy: Patient Spontanous Breathing and Patient connected to nasal cannula oxygen  Post-op Assessment: Report given to RN and Post -op Vital signs reviewed and stable  Post vital signs: Reviewed and stable  Last Vitals:  Vitals:   12/27/16 1022 12/27/16 1118  BP: 118/84   Pulse: 60 (!) 56  Resp: 20 20  Temp: (!) 36.4 C (!) 35.8 C  SpO2: 98% 97%    Last Pain:  Vitals:   12/27/16 1118  TempSrc: Tympanic         Complications: No apparent anesthesia complications

## 2016-12-27 NOTE — Op Note (Signed)
[facility] Gastroenterology Patient Name: Dean Gonzales Procedure Date: 12/27/2016 10:39 AM MRN: [id number] Account #: [id number] Date of Birth: 03/30/1955 Admit Type: Outpatient Age: 61 Room: [facility] ENDO ROOM 4 Gender: Male Note Status: Finalized Procedure:            Colonoscopy Indications:          Screening for colorectal malignant neoplasm Providers:            Manya Silvas, MD Referring MD:         Dion Body (Referring MD) Medicines:            Propofol per Anesthesia Complications:        No immediate complications. Procedure:            Pre-Anesthesia Assessment:                       - After reviewing the risks and benefits, the patient                        was deemed in satisfactory condition to undergo the                        procedure.                       After obtaining informed consent, the colonoscope was                        passed under direct vision. Throughout the procedure,                        the patient's blood pressure, pulse, and oxygen                        saturations were monitored continuously. The                        Colonoscope was introduced through the anus and                        advanced to the the cecum, identified by appendiceal                        orifice and ileocecal valve. The Colonoscope was                        introduced through the and advanced to the the cecum,                        identified by appendiceal orifice and ileocecal valve.                        The colonoscopy was performed without difficulty. The                        patient tolerated the procedure well. The quality of                        the bowel preparation was good. Findings:      Multiple medium-mouthed diverticula were found in the sigmoid colon and  descending colon.      Internal hemorrhoids were found during endoscopy. The hemorrhoids were       small and Grade I (internal hemorrhoids that do not  prolapse).      The exam was otherwise without abnormality. Impression:           - Diverticulosis in the sigmoid colon and in the                        descending colon.                       - Internal hemorrhoids.                       - The examination was otherwise normal.                       - No specimens collected. Recommendation:       - Repeat colonoscopy in 10 years for screening purposes. Procedure Code(s):    --- Professional ---                       [phone], Colonoscopy, flexible; diagnostic, including                        collection of specimen(s) by brushing or washing, when                        performed (separate procedure) Diagnosis Code(s):    --- Professional ---                       Z12.11, Encounter for screening for malignant neoplasm                        of colon                       K64.0, First degree hemorrhoids                       K57.30, Diverticulosis of large intestine without                        perforation or abscess without bleeding CPT copyright 2016 American Medical Association. All rights reserved. The codes documented in this report are preliminary and upon coder review may  be revised to meet current compliance requirements. Manya Silvas, MD 12/27/2016 11:18:35 AM This report has been signed electronically. Number of Addenda: 0 Note Initiated On: 12/27/2016 10:39 AM Scope Withdrawal Time: 0 hours 13 minutes 58 seconds  Total Procedure Duration: 0 hours 22 minutes 51 seconds       [facility]

## 2016-12-27 NOTE — H&P (Signed)
   Primary Care Physician:  Dion Body, MD Primary Gastroenterologist:  Dr. Vira Agar  Pre-Procedure History & Physical: HPI:  Dean Gonzales is a 61 y.o. male is here for an colonoscopy.   Past Medical History:  Diagnosis Date  . Cancer [facility])    Prostate Cancer    Past Surgical History:  Procedure Laterality Date  . JOINT REPLACEMENT     Hip Replacement x 2 (RT 2013) and (LT 2014)  . PROSTATE SURGERY      Prior to Admission medications   Medication Sig Start Date End Date Taking? Authorizing Provider  Ascorbic Acid (VITAMIN C) 100 MG tablet Take 100 mg by mouth daily.   Yes [provider]  cholecalciferol (VITAMIN D) 1000 units tablet Take 1,000 Units by mouth daily.   Yes [provider]  Multiple Vitamin (MULTIVITAMIN) tablet Take 1 tablet by mouth daily.   Yes [provider]  Omega-3 Fatty Acids (FISH OIL) 1000 MG CAPS Take 1 capsule by mouth daily.   Yes [provider]    Allergies as of 10/18/2016  . (Not on File)    History reviewed. No pertinent family history.  Social History   Social History  . Marital status: Married    Spouse name: N/A  . Number of children: N/A  . Years of education: N/A   Occupational History  . Not on file.   Social History Main Topics  . Smoking status: Never Smoker  . Smokeless tobacco: Never Used  . Alcohol use 1.8 oz/week    3 Glasses of wine per week     Comment: 3 glasses/ per week  . Drug use: No  . Sexual activity: Not on file   Other Topics Concern  . Not on file   Social History Narrative  . No narrative on file    Review of Systems: See HPI, otherwise negative ROS  Physical Exam: BP 118/84   Pulse 60   Temp (!) 97.5 F (36.4 C) (Tympanic)   Resp 20   Ht 6\' 2"  (1.88 m)   Wt 97.1 kg (214 lb)   SpO2 98%   BMI 27.48 kg/m  General:   Alert,  pleasant and cooperative in NAD Head:  Normocephalic and atraumatic. Neck:  Supple; no masses or thyromegaly. Lungs:   Clear throughout to auscultation.    Heart:  Regular rate and rhythm. Abdomen:  Soft, nontender and nondistended. Normal bowel sounds, without guarding, and without rebound.   Neurologic:  Alert and  oriented x4;  grossly normal neurologically.  Impression/Plan: JAYMIAN BOGART is here for an colonoscopy to be performed for colon cancer screening.  Risks, benefits, limitations, and alternatives regarding  colonoscopy have been reviewed with the patient.  Questions have been answered.  All parties agreeable.   Gaylyn Cheers, MD  12/27/2016, 10:44 AM

## 2016-12-28 ENCOUNTER — Encounter: Payer: Self-pay | Admitting: Unknown Physician Specialty

## 2019-05-11 ENCOUNTER — Ambulatory Visit: Payer: BC Managed Care – PPO | Attending: Internal Medicine

## 2019-05-11 DIAGNOSIS — Z23 Encounter for immunization: Secondary | ICD-10-CM | POA: Insufficient documentation

## 2019-05-11 NOTE — Progress Notes (Signed)
   Covid-19 Vaccination Clinic  Name:  Dean Gonzales    MRN: [id number] DOB: May 19, 1955  05/11/2019  [name] was observed post Covid-19 immunization for 15 minutes without incidence. He was provided with Vaccine Information Sheet and instruction to access the V-Safe system.   [name] was instructed to call 911 with any severe reactions post vaccine: Marland Kitchen Difficulty breathing  . Swelling of your face and throat  . A fast heartbeat  . A bad rash all over your body  . Dizziness and weakness    Immunizations Administered    Name Date Dose VIS Date Route   Pfizer COVID-19 Vaccine 05/11/2019  2:17 PM 0.3 mL 02/21/2019 Intramuscular   Manufacturer: [employer]   Lot: [id number]   [place]: [id number]

## 2019-06-03 ENCOUNTER — Ambulatory Visit: Payer: BC Managed Care – PPO

## 2019-06-04 ENCOUNTER — Ambulatory Visit: Payer: BC Managed Care – PPO | Attending: Internal Medicine

## 2019-06-04 DIAGNOSIS — Z23 Encounter for immunization: Secondary | ICD-10-CM

## 2019-06-04 NOTE — Progress Notes (Signed)
   Covid-19 Vaccination Clinic  Name:  Dean Gonzales    MRN: [id number] DOB: 11-Mar-1956  06/04/2019  [name] was observed post Covid-19 immunization for 15 minutes without incident. He was provided with Vaccine Information Sheet and instruction to access the V-Safe system.   [name] was instructed to call 911 with any severe reactions post vaccine: Marland Kitchen Difficulty breathing  . Swelling of face and throat  . A fast heartbeat  . A bad rash all over body  . Dizziness and weakness   Immunizations Administered    Name Date Dose VIS Date Route   Pfizer COVID-19 Vaccine 06/04/2019  9:05 AM 0.3 mL 02/21/2019 Intramuscular   Manufacturer: [employer]   Lot: [id number]   [place]: [id number]

## 2020-07-08 ENCOUNTER — Ambulatory Visit: Payer: BC Managed Care – PPO | Admitting: Dermatology

## 2020-07-08 ENCOUNTER — Other Ambulatory Visit: Payer: Self-pay

## 2020-07-08 DIAGNOSIS — D239 Other benign neoplasm of skin, unspecified: Secondary | ICD-10-CM

## 2020-07-08 DIAGNOSIS — Z85828 Personal history of other malignant neoplasm of skin: Secondary | ICD-10-CM | POA: Diagnosis not present

## 2020-07-08 DIAGNOSIS — B353 Tinea pedis: Secondary | ICD-10-CM

## 2020-07-08 DIAGNOSIS — D1800 Hemangioma unspecified site: Secondary | ICD-10-CM

## 2020-07-08 DIAGNOSIS — L820 Inflamed seborrheic keratosis: Secondary | ICD-10-CM | POA: Diagnosis not present

## 2020-07-08 DIAGNOSIS — L814 Other melanin hyperpigmentation: Secondary | ICD-10-CM

## 2020-07-08 DIAGNOSIS — D229 Melanocytic nevi, unspecified: Secondary | ICD-10-CM

## 2020-07-08 DIAGNOSIS — D2361 Other benign neoplasm of skin of right upper limb, including shoulder: Secondary | ICD-10-CM | POA: Diagnosis not present

## 2020-07-08 DIAGNOSIS — Z1283 Encounter for screening for malignant neoplasm of skin: Secondary | ICD-10-CM

## 2020-07-08 DIAGNOSIS — L578 Other skin changes due to chronic exposure to nonionizing radiation: Secondary | ICD-10-CM

## 2020-07-08 DIAGNOSIS — Z86018 Personal history of other benign neoplasm: Secondary | ICD-10-CM | POA: Diagnosis not present

## 2020-07-08 DIAGNOSIS — L821 Other seborrheic keratosis: Secondary | ICD-10-CM

## 2020-07-08 MED ORDER — KETOCONAZOLE 2 % EX CREA: 1.0000 "application " | TOPICAL_CREAM | Freq: Every day | CUTANEOUS | 11 refills | Status: AC

## 2020-07-08 NOTE — Patient Instructions (Signed)

## 2020-07-08 NOTE — Progress Notes (Signed)
Follow-Up Visit   Subjective  Dean Gonzales is a 65 y.o. male who presents for the following: Annual Exam (History of BCC, Dysplastic nevi - TBSE today). The patient presents for Total-Body Skin Exam (TBSE) for skin cancer screening and mole check.  The following portions of the chart were reviewed this encounter and updated as appropriate:   Tobacco  Allergies  Meds  Problems  Med Hx  Surg Hx  Fam Hx     Review of Systems:  No other skin or systemic complaints except as noted in HPI or Assessment and Plan.  Objective  Well appearing patient in no apparent distress; mood and affect are within normal limits.  A full examination was performed including scalp, head, eyes, ears, nose, lips, neck, chest, axillae, abdomen, back, buttocks, bilateral upper extremities, bilateral lower extremities, hands, feet, fingers, toes, fingernails, and toenails. All findings within normal limits unless otherwise noted below.  Objective  Left clavicle: Erythematous keratotic or waxy stuck-on papule or plaque.   Objective  Right Shoulder: Firm pink/brown papulenodule with dimple sign.   Objective  Bilateral feet: Scaliness    Assessment & Plan    Lentigines - Scattered tan macules - Due to sun exposure - Benign-appering, observe - Recommend daily broad spectrum sunscreen SPF 30+ to sun-exposed areas, reapply every 2 hours as needed. - Call for any changes  Seborrheic Keratoses - Stuck-on, waxy, tan-brown papules and/or plaques  - Benign-appearing - Discussed benign etiology and prognosis. - Observe - Call for any changes  Melanocytic Nevi - Tan-brown and/or pink-flesh-colored symmetric macules and papules - Benign appearing on exam today - Observation - Call clinic for new or changing moles - Recommend daily use of broad spectrum spf 30+ sunscreen to sun-exposed areas.   Hemangiomas - Red papules - Discussed benign nature - Observe - Call for any changes  Actinic  Damage - Chronic condition, secondary to cumulative UV/sun exposure - diffuse scaly erythematous macules with underlying dyspigmentation - Recommend daily broad spectrum sunscreen SPF 30+ to sun-exposed areas, reapply every 2 hours as needed.  - Staying in the shade or wearing long sleeves, sun glasses (UVA+UVB protection) and wide brim hats (4-inch brim around the entire circumference of the hat) are also recommended for sun protection.  - Call for new or changing lesions.  Skin cancer screening performed today.  History of Basal Cell Carcinoma of the Skin - No evidence of recurrence today - Recommend regular full body skin exams - Recommend daily broad spectrum sunscreen SPF 30+ to sun-exposed areas, reapply every 2 hours as needed.  - Call if any new or changing lesions are noted between office visits  History of Dysplastic Nevi - No evidence of recurrence today - Recommend regular full body skin exams - Recommend daily broad spectrum sunscreen SPF 30+ to sun-exposed areas, reapply every 2 hours as needed.  - Call if any new or changing lesions are noted between office visits  Inflamed seborrheic keratosis Left clavicle Destruction of lesion - Left clavicle Complexity: simple   Destruction method: cryotherapy   Informed consent: discussed and consent obtained   Timeout:  patient name, date of birth, surgical site, and procedure verified Lesion destroyed using liquid nitrogen: Yes   Region frozen until ice ball extended beyond lesion: Yes   Outcome: patient tolerated procedure well with no complications   Post-procedure details: wound care instructions given    Dermatofibroma Right Shoulder Benign, observe.   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  I, Ashok Cordia, CMA, am acting as scribe for  Sarina Ser, MD .  Documentation: I have reviewed the above documentation for accuracy and completeness, and I agree with the above.  Sarina Ser, MD

## 2020-07-13 ENCOUNTER — Encounter: Payer: Self-pay | Admitting: Dermatology

## 2021-07-13 ENCOUNTER — Ambulatory Visit: Payer: Medicare HMO | Admitting: Dermatology

## 2021-07-13 DIAGNOSIS — D229 Melanocytic nevi, unspecified: Secondary | ICD-10-CM

## 2021-07-13 DIAGNOSIS — L821 Other seborrheic keratosis: Secondary | ICD-10-CM

## 2021-07-13 DIAGNOSIS — Z86018 Personal history of other benign neoplasm: Secondary | ICD-10-CM

## 2021-07-13 DIAGNOSIS — Z85828 Personal history of other malignant neoplasm of skin: Secondary | ICD-10-CM

## 2021-07-13 DIAGNOSIS — Z1283 Encounter for screening for malignant neoplasm of skin: Secondary | ICD-10-CM

## 2021-07-13 DIAGNOSIS — B353 Tinea pedis: Secondary | ICD-10-CM | POA: Diagnosis not present

## 2021-07-13 DIAGNOSIS — L820 Inflamed seborrheic keratosis: Secondary | ICD-10-CM | POA: Diagnosis not present

## 2021-07-13 DIAGNOSIS — L578 Other skin changes due to chronic exposure to nonionizing radiation: Secondary | ICD-10-CM

## 2021-07-13 DIAGNOSIS — L719 Rosacea, unspecified: Secondary | ICD-10-CM | POA: Diagnosis not present

## 2021-07-13 DIAGNOSIS — D1800 Hemangioma unspecified site: Secondary | ICD-10-CM

## 2021-07-13 DIAGNOSIS — L814 Other melanin hyperpigmentation: Secondary | ICD-10-CM

## 2021-07-13 NOTE — Progress Notes (Signed)
? ?Follow-Up Visit ?  ?Subjective  ?Dean Gonzales is a 66 y.o. male who presents for the following: Annual Exam (Hx BCC, dysplastic nevi ). ?The patient presents for Total-Body Skin Exam (TBSE) for skin cancer screening and mole check.  The patient has spots, moles and lesions to be evaluated, some may be new or changing and the patient has concerns that these could be cancer.  ? ?The following portions of the chart were reviewed this encounter and updated as appropriate:  ? Tobacco  Allergies  Meds  Problems  Med Hx  Surg Hx  Fam Hx   ?  ?Review of Systems:  No other skin or systemic complaints except as noted in HPI or Assessment and Plan. ? ?Objective  ?Well appearing patient in no apparent distress; mood and affect are within normal limits. ? ?A full examination was performed including scalp, head, eyes, ears, nose, lips, neck, chest, axillae, abdomen, back, buttocks, bilateral upper extremities, bilateral lower extremities, hands, feet, fingers, toes, fingernails, and toenails. All findings within normal limits unless otherwise noted below. ? ?face ?Mid face erythema with telangiectasias +/- scattered inflammatory papules.  ? ?left post thigh ?Stuck-on, waxy, tan-brown papules-- Discussed benign etiology and prognosis.  ? ?feet ?Scaling and maceration web spaces and over distal and lateral soles.  ? ? ?Assessment & Plan  ?Rosacea ?face ?Rosacea is a chronic progressive skin condition usually affecting the face of adults, causing redness and/or acne bumps. It is treatable but not curable. It sometimes affects the eyes (ocular rosacea) as well. It may respond to topical and/or systemic medication and can flare with stress, sun exposure, alcohol, exercise and some foods.  Daily application of broad spectrum spf 30+ sunscreen to face is recommended to reduce flares.  ? ?Discussed the treatment option of BBL/laser.  Typically we recommend 1-3 treatment sessions about 5-8 weeks apart for best results.  The  patient's condition may require "maintenance treatments" in the future.  The fee for BBL / laser treatments is $350 per treatment session for the whole face.  A fee can be quoted for other parts of the body. ?Insurance typically does not pay for BBL/laser treatments and therefore the fee is an out-of-pocket cost. ? ?Inflamed seborrheic keratosis ?left post thigh ?Destruction of lesion - left post thigh ?Complexity: simple   ?Destruction method: cryotherapy   ?Informed consent: discussed and consent obtained   ?Timeout:  patient name, date of birth, surgical site, and procedure verified ?Lesion destroyed using liquid nitrogen: Yes   ?Region frozen until ice ball extended beyond lesion: Yes   ?Outcome: patient tolerated procedure well with no complications   ?Post-procedure details: wound care instructions given   ? ?Tinea pedis of both feet ?feet ?Tinea pedis/tinea unguium  ?Chronic and persistent condition with duration or expected duration over one year. Condition is symptomatic / bothersome to patient. Not to goal.  ?Cont Ketoconazole cream apply to feet at bedtime  ? ?Related Medications ?ketoconazole (NIZORAL) 2 % cream ?Apply 1 application topically at bedtime. ? ?Lentigines ?- Scattered tan macules ?- Due to sun exposure ?- Benign-appearing, observe ?- Recommend daily broad spectrum sunscreen SPF 30+ to sun-exposed areas, reapply every 2 hours as needed. ?- Call for any changes ? ?Seborrheic Keratoses ?- Stuck-on, waxy, tan-brown papules and/or plaques  ?- Benign-appearing ?- Discussed benign etiology and prognosis. ?- Observe ?- Call for any changes ? ?Melanocytic Nevi ?- Tan-brown and/or pink-flesh-colored symmetric macules and papules ?- Benign appearing on exam today ?- Observation ?- Call  clinic for new or changing moles ?- Recommend daily use of broad spectrum spf 30+ sunscreen to sun-exposed areas.  ? ?Hemangiomas ?- Red papules ?- Discussed benign nature ?- Observe ?- Call for any changes ? ?Actinic  Damage ?- Chronic condition, secondary to cumulative UV/sun exposure ?- diffuse scaly erythematous macules with underlying dyspigmentation ?- Recommend daily broad spectrum sunscreen SPF 30+ to sun-exposed areas, reapply every 2 hours as needed.  ?- Staying in the shade or wearing long sleeves, sun glasses (UVA+UVB protection) and wide brim hats (4-inch brim around the entire circumference of the hat) are also recommended for sun protection.  ?- Call for new or changing lesions. ? ?History of Dysplastic Nevi ?Multiple see history  ?- No evidence of recurrence today ?- Recommend regular full body skin exams ?- Recommend daily broad spectrum sunscreen SPF 30+ to sun-exposed areas, reapply every 2 hours as needed.  ?- Call if any new or changing lesions are noted between office visits  ? ?History of Basal Cell Carcinoma of the Skin ?Left ant shoulder inf to lateral clavicle 2009 ?- No evidence of recurrence today ?- Recommend regular full body skin exams ?- Recommend daily broad spectrum sunscreen SPF 30+ to sun-exposed areas, reapply every 2 hours as needed.  ?- Call if any new or changing lesions are noted between office visits  ? ?Skin cancer screening performed today.  ? ?Return in about 1 year (around 07/14/2022) for TBSE, hx of BCC, hx of Dysplastic nevus. ? ?I, Marye Round, CMA, am acting as scribe for Sarina Ser, MD .  ?Documentation: I have reviewed the above documentation for accuracy and completeness, and I agree with the above. ? ?Sarina Ser, MD ? ?

## 2021-07-13 NOTE — Patient Instructions (Addendum)

## 2021-07-13 NOTE — Progress Notes (Deleted)
? ?  Follow-Up Visit ?  ?Subjective  ?Dean Gonzales is a 66 y.o. male who presents for the following: No chief complaint on file.. ? ? ?The following portions of the chart were reviewed this encounter and updated as appropriate:  ?  ?  ? ?Review of Systems:  No other skin or systemic complaints except as noted in HPI or Assessment and Plan. ? ?Objective  ?Well appearing patient in no apparent distress; mood and affect are within normal limits. ? ?A full examination was performed including scalp, head, eyes, ears, nose, lips, neck, chest, axillae, abdomen, back, buttocks, bilateral upper extremities, bilateral lower extremities, hands, feet, fingers, toes, fingernails, and toenails. All findings within normal limits unless otherwise noted below. ? ? ? ?Assessment & Plan  ? ?Lentigines ?- Scattered tan macules ?- Due to sun exposure ?- Benign-appearing, observe ?- Recommend daily broad spectrum sunscreen SPF 30+ to sun-exposed areas, reapply every 2 hours as needed. ?- Call for any changes ? ?Seborrheic Keratoses ?- Stuck-on, waxy, tan-brown papules and/or plaques  ?- Benign-appearing ?- Discussed benign etiology and prognosis. ?- Observe ?- Call for any changes ? ?Melanocytic Nevi ?- Tan-brown and/or pink-flesh-colored symmetric macules and papules ?- Benign appearing on exam today ?- Observation ?- Call clinic for new or changing moles ?- Recommend daily use of broad spectrum spf 30+ sunscreen to sun-exposed areas.  ? ?Hemangiomas ?- Red papules ?- Discussed benign nature ?- Observe ?- Call for any changes ? ?Actinic Damage ?- Chronic condition, secondary to cumulative UV/sun exposure ?- diffuse scaly erythematous macules with underlying dyspigmentation ?- Recommend daily broad spectrum sunscreen SPF 30+ to sun-exposed areas, reapply every 2 hours as needed.  ?- Staying in the shade or wearing long sleeves, sun glasses (UVA+UVB protection) and wide brim hats (4-inch brim around the entire circumference of the hat)  are also recommended for sun protection.  ?- Call for new or changing lesions. ? ?Skin cancer screening performed today. ? ?No follow-ups on file. ? ?I, Rudell Cobb, CMA, am acting as scribe for Sarina Ser, MD . ? ? ?

## 2021-07-26 ENCOUNTER — Encounter: Payer: Self-pay | Admitting: Dermatology

## 2022-07-19 ENCOUNTER — Ambulatory Visit (INDEPENDENT_AMBULATORY_CARE_PROVIDER_SITE_OTHER): Payer: Medicare HMO | Admitting: Dermatology

## 2022-07-19 VITALS — BP 102/64 | HR 63

## 2022-07-19 DIAGNOSIS — L719 Rosacea, unspecified: Secondary | ICD-10-CM | POA: Diagnosis not present

## 2022-07-19 DIAGNOSIS — C44212 Basal cell carcinoma of skin of right ear and external auricular canal: Secondary | ICD-10-CM | POA: Diagnosis not present

## 2022-07-19 DIAGNOSIS — Z1283 Encounter for screening for malignant neoplasm of skin: Secondary | ICD-10-CM

## 2022-07-19 DIAGNOSIS — L821 Other seborrheic keratosis: Secondary | ICD-10-CM

## 2022-07-19 DIAGNOSIS — Z85828 Personal history of other malignant neoplasm of skin: Secondary | ICD-10-CM

## 2022-07-19 DIAGNOSIS — D1801 Hemangioma of skin and subcutaneous tissue: Secondary | ICD-10-CM

## 2022-07-19 DIAGNOSIS — Z79899 Other long term (current) drug therapy: Secondary | ICD-10-CM

## 2022-07-19 DIAGNOSIS — L578 Other skin changes due to chronic exposure to nonionizing radiation: Secondary | ICD-10-CM

## 2022-07-19 DIAGNOSIS — X32XXXA Exposure to sunlight, initial encounter: Secondary | ICD-10-CM

## 2022-07-19 DIAGNOSIS — B353 Tinea pedis: Secondary | ICD-10-CM | POA: Diagnosis not present

## 2022-07-19 DIAGNOSIS — Z7189 Other specified counseling: Secondary | ICD-10-CM

## 2022-07-19 DIAGNOSIS — D489 Neoplasm of uncertain behavior, unspecified: Secondary | ICD-10-CM

## 2022-07-19 DIAGNOSIS — L820 Inflamed seborrheic keratosis: Secondary | ICD-10-CM

## 2022-07-19 DIAGNOSIS — Z86018 Personal history of other benign neoplasm: Secondary | ICD-10-CM

## 2022-07-19 DIAGNOSIS — D485 Neoplasm of uncertain behavior of skin: Secondary | ICD-10-CM

## 2022-07-19 DIAGNOSIS — W908XXA Exposure to other nonionizing radiation, initial encounter: Secondary | ICD-10-CM

## 2022-07-19 DIAGNOSIS — L814 Other melanin hyperpigmentation: Secondary | ICD-10-CM

## 2022-07-19 NOTE — Progress Notes (Signed)
Follow-Up Visit   Subjective  Dean Gonzales is a 67 y.o. male who presents for the following: Skin Cancer Screening and Full Body Skin Exam Bump at forehead, spot at right ear, spot at corner of left nose.  The patient presents for Total-Body Skin Exam (TBSE) for skin cancer screening and mole check. The patient has spots, moles and lesions to be evaluated, some may be new or changing and the patient has concerns that these could be cancer.  The following portions of the chart were reviewed this encounter and updated as appropriate: medications, allergies, medical history  Review of Systems:  No other skin or systemic complaints except as noted in HPI or Assessment and Plan.  Objective  Well appearing patient in no apparent distress; mood and affect are within normal limits.  A full examination was performed including scalp, head, eyes, ears, nose, lips, neck, chest, axillae, abdomen, back, buttocks, bilateral upper extremities, bilateral lower extremities, hands, feet, fingers, toes, fingernails, and toenails. All findings within normal limits unless otherwise noted below.   Relevant physical exam findings are noted in the Assessment and Plan.  right forehead x 1, left lateral knee x 1 (2) Erythematous stuck-on, waxy papule or plaque  right ear inferior helix 0.8 cm red crusted papule        left upper lip medial to nasolabial Crusted papule see photo        Assessment & Plan   Rosacea face Rosacea is a chronic progressive skin condition usually affecting the face of adults, causing redness and/or acne bumps. It is treatable but not curable. It sometimes affects the eyes (ocular rosacea) as well. It may respond to topical and/or systemic medication and can flare with stress, sun exposure, alcohol, exercise and some foods.  Daily application of broad spectrum spf 30+ sunscreen to face is recommended to reduce flares.    Discussed the treatment option of BBL/laser.   Typically we recommend 1-3 treatment sessions about 5-8 weeks apart for best results.  The patient's condition may require "maintenance treatments" in the future.  The fee for BBL / laser treatments is $350 per treatment session for the whole face.  A fee can be quoted for other parts of the body. Insurance typically does not pay for BBL/laser treatments and therefore the fee is an out-of-pocket cost.  Tinea pedis of both feet Feet Scaling and maceration web spaces and over distal and lateral soles.  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 Condition is symptomatic / bothersome to patient. Not to goal.  Cont Ketoconazole cream apply to feet at bedtime   LENTIGINES, SEBORRHEIC KERATOSES, HEMANGIOMAS - Benign normal skin lesions - Benign-appearing - Call for any changes  MELANOCYTIC NEVI - Tan-brown and/or pink-flesh-colored symmetric macules and papules - Benign appearing on exam today - Observation - Call clinic for new or changing moles - Recommend daily use of broad spectrum spf 30+ sunscreen to sun-exposed areas.   ACTINIC DAMAGE - Chronic condition, secondary to cumulative UV/sun exposure - diffuse scaly erythematous macules with underlying dyspigmentation - Recommend daily broad spectrum sunscreen SPF 30+ to sun-exposed areas, reapply every 2 hours as needed.  - Staying in the shade or wearing long sleeves, sun glasses (UVA+UVB protection) and wide brim hats (4-inch brim around the entire circumference of the hat) are also recommended for sun protection.  - Call for new or changing lesions.  HISTORY OF BASAL CELL CARCINOMA OF THE SKIN Left  ant shoulder inf to lateral clavicle 2009  - No evidence of recurrence today - Recommend regular full body skin exams - Recommend daily broad spectrum sunscreen SPF 30+ to sun-exposed areas, reapply every 2 hours as needed.  - Call if any new or changing lesions are noted  between office visits  HISTORY OF DYSPLASTIC NEVUS No evidence of recurrence today Recommend regular full body skin exams Recommend daily broad spectrum sunscreen SPF 30+ to sun-exposed areas, reapply every 2 hours as needed.  Call if any new or changing lesions are noted between office visits  SKIN CANCER SCREENING PERFORMED TODAY.  Inflamed seborrheic keratosis (2) right forehead x 1, left lateral knee x 1 Symptomatic, irritating, patient would like treated. Destruction of lesion - right forehead x 1, left lateral knee x 1 Destruction method: cryotherapy   Informed consent: discussed and consent obtained   Lesion destroyed using liquid nitrogen: Yes   Region frozen until ice ball extended beyond lesion: Yes   Outcome: patient tolerated procedure well with no complications   Post-procedure details: wound care instructions given   Additional details:  Prior to procedure, discussed risks of blister formation, small wound, skin dyspigmentation, or rare scar following cryotherapy. Recommend Vaseline ointment to treated areas while healing.   Neoplasm of uncertain behavior (2) right ear inferior helix  Epidermal / dermal shaving  Lesion diameter (cm):  0.8 Informed consent: discussed and consent obtained   Timeout: patient name, date of birth, surgical site, and procedure verified   Procedure prep:  Patient was prepped and draped in usual sterile fashion Prep type:  Isopropyl alcohol Anesthesia: the lesion was anesthetized in a standard fashion   Anesthetic:  1% lidocaine w/ epinephrine 1-100,000 buffered w/ 8.4% NaHCO3 Instrument used: flexible razor blade   Hemostasis achieved with: pressure, aluminum chloride and electrodesiccation   Outcome: patient tolerated procedure well   Post-procedure details: sterile dressing applied and wound care instructions given   Dressing type: bandage and petrolatum    Destruction of lesion Complexity: extensive   Destruction method:  electrodesiccation and curettage   Informed consent: discussed and consent obtained   Timeout:  patient name, date of birth, surgical site, and procedure verified Procedure prep:  Patient was prepped and draped in usual sterile fashion Prep type:  Isopropyl alcohol Anesthesia: the lesion was anesthetized in a standard fashion   Anesthetic:  1% lidocaine w/ epinephrine 1-100,000 buffered w/ 8.4% NaHCO3 Curettage performed in three different directions: Yes   Electrodesiccation performed over the curetted area: Yes   Lesion length (cm):  0.8 Lesion width (cm):  0.8 Margin per side (cm):  0.2 Final wound size (cm):  1.2 Hemostasis achieved with:  pressure, aluminum chloride and electrodesiccation Outcome: patient tolerated procedure well with no complications   Post-procedure details: sterile dressing applied and wound care instructions given   Dressing type: bandage and petrolatum    Specimen 1 - Surgical pathology Differential Diagnosis: r/o BCC Check Margins: No R/o bcc at right ear inferior helix  left upper lip medial to nasolabial Crust at Left upper lip medial to nasolabial  Duration about 1 month Photo today Will recheck in 6 - 8 weeks  Plan biopsy if persistent  Return for 6 week follow up on spot at left upper lip , 1 year tbse .  IAsher Muir, CMA, am acting as scribe for Armida Sans, MD.  Documentation: I have reviewed the above documentation for accuracy and completeness, and I agree with the above.  Armida Sans, MD

## 2022-07-19 NOTE — Patient Instructions (Addendum)
Biopsy Wound Care Instructions  Leave the original bandage on for 24 hours if possible.  If the bandage becomes soaked or soiled before that time, it is OK to remove it and examine the wound.  A small amount of post-operative bleeding is normal.  If excessive bleeding occurs, remove the bandage, place gauze over the site and apply continuous pressure (no peeking) over the area for 30 minutes. If this does not work, please call our clinic as soon as possible or page your doctor if it is after hours.   Once a day, cleanse the wound with soap and water. It is fine to shower. If a thick crust develops you may use a Q-tip dipped into dilute hydrogen peroxide (mix 1:1 with water) to dissolve it.  Hydrogen peroxide can slow the healing process, so use it only as needed.    After washing, apply petroleum jelly (Vaseline) or an antibiotic ointment if your doctor prescribed one for you, followed by a bandage.    For best healing, the wound should be covered with a layer of ointment at all times. If you are not able to keep the area covered with a bandage to hold the ointment in place, this may mean re-applying the ointment several times a day.  Continue this wound care until the wound has healed and is no longer open.   Itching and mild discomfort is normal during the healing process. However, if you develop pain or severe itching, please call our office.   If you have any discomfort, you can take Tylenol (acetaminophen) or ibuprofen as directed on the bottle. (Please do not take these if you have an allergy to them or cannot take them for another reason).  Some redness, tenderness and white or yellow material in the wound is normal healing.  If the area becomes very sore and red, or develops a thick yellow-green material (pus), it may be infected; please notify us.    If you have stitches, return to clinic as directed to have the stitches removed. You will continue wound care for 2-3 days after the stitches  are removed.   Wound healing continues for up to one year following surgery. It is not unusual to experience pain in the scar from time to time during the interval.  If the pain becomes severe or the scar thickens, you should notify the office.    A slight amount of redness in a scar is expected for the first six months.  After six months, the redness will fade and the scar will soften and fade.  The color difference becomes less noticeable with time.  If there are any problems, return for a post-op surgery check at your earliest convenience.  To improve the appearance of the scar, you can use silicone scar gel, cream, or sheets (such as Mederma or Serica) every night for up to one year. These are available over the counter (without a prescription).  Please call our office at [phone] for any questions or concerns.    Electrodesiccation and Curettage ("Scrape and Burn") Wound Care Instructions  Leave the original bandage on for 24 hours if possible.  If the bandage becomes soaked or soiled before that time, it is OK to remove it and examine the wound.  A small amount of post-operative bleeding is normal.  If excessive bleeding occurs, remove the bandage, place gauze over the site and apply continuous pressure (no peeking) over the area for 30 minutes. If this does not work, please call   our clinic as soon as possible or page your doctor if it is after hours.   Once a day, cleanse the wound with soap and water. It is fine to shower. If a thick crust develops you may use a Q-tip dipped into dilute hydrogen peroxide (mix 1:1 with water) to dissolve it.  Hydrogen peroxide can slow the healing process, so use it only as needed.    After washing, apply petroleum jelly (Vaseline) or an antibiotic ointment if your doctor prescribed one for you, followed by a bandage.    For best healing, the wound should be covered with a layer of ointment at all times. If you are not able to keep the area covered  with a bandage to hold the ointment in place, this may mean re-applying the ointment several times a day.  Continue this wound care until the wound has healed and is no longer open. It may take several weeks for the wound to heal and close.  Itching and mild discomfort is normal during the healing process.  If you have any discomfort, you can take Tylenol (acetaminophen) or ibuprofen as directed on the bottle. (Please do not take these if you have an allergy to them or cannot take them for another reason).  Some redness, tenderness and white or yellow material in the wound is normal healing.  If the area becomes very sore and red, or develops a thick yellow-green material (pus), it may be infected; please notify us.    Wound healing continues for up to one year following surgery. It is not unusual to experience pain in the scar from time to time during the interval.  If the pain becomes severe or the scar thickens, you should notify the office.    A slight amount of redness in a scar is expected for the first six months.  After six months, the redness will fade and the scar will soften and fade.  The color difference becomes less noticeable with time.  If there are any problems, return for a post-op surgery check at your earliest convenience.  To improve the appearance of the scar, you can use silicone scar gel, cream, or sheets (such as Mederma or Serica) every night for up to one year. These are available over the counter (without a prescription).  Please call our office at [phone] for any questions or concerns.  Melanoma ABCDEs  Melanoma is the most dangerous type of skin cancer, and is the leading cause of death from skin disease.  You are more likely to develop melanoma if you: Have light-colored skin, light-colored eyes, or red or blond hair Spend a lot of time in the sun Tan regularly, either outdoors or in a tanning bed Have had blistering sunburns, especially during  childhood Have a close family member who has had a melanoma Have atypical moles or large birthmarks  Early detection of melanoma is key since treatment is typically straightforward and cure rates are extremely high if we catch it early.   The first sign of melanoma is often a change in a mole or a new dark spot.  The ABCDE system is a way of remembering the signs of melanoma.  A for asymmetry:  The two halves do not match. B for border:  The edges of the growth are irregular. C for color:  A mixture of colors are present instead of an even brown color. D for diameter:  Melanomas are usually (but not always) greater than 6mm - the size   of a pencil eraser. E for evolution:  The spot keeps changing in size, shape, and color.  Please check your skin once per month between visits. You can use a small mirror in front and a large mirror behind you to keep an eye on the back side or your body.   If you see any new or changing lesions before your next follow-up, please call to schedule a visit.  Please continue daily skin protection including broad spectrum sunscreen SPF 30+ to sun-exposed areas, reapplying every 2 hours as needed when you're outdoors.   Staying in the shade or wearing long sleeves, sun glasses (UVA+UVB protection) and wide brim hats (4-inch brim around the entire circumference of the hat) are also recommended for sun protection.    Due to recent changes in healthcare laws, you may see results of your pathology and/or laboratory studies on MyChart before the doctors have had a chance to review them. We understand that in some cases there may be results that are confusing or concerning to you. Please understand that not all results are received at the same time and often the doctors may need to interpret multiple results in order to provide you with the best plan of care or course of treatment. Therefore, we ask that you please give us 2 business days to thoroughly review all your  results before contacting the office for clarification. Should we see a critical lab result, you will be contacted sooner.   If You Need Anything After Your Visit  If you have any questions or concerns for your doctor, please call our main line at [phone] and press option 4 to reach your doctor's medical assistant. If no one answers, please leave a voicemail as directed and we will return your call as soon as possible. Messages left after 4 pm will be answered the following business day.   You may also send us a message via MyChart. We typically respond to MyChart messages within 1-2 business days.  For prescription refills, please ask your pharmacy to contact our office. Our fax number is [phone].  If you have an urgent issue when the clinic is closed that cannot wait until the next business day, you can page your doctor at the number below.    Please note that while we do our best to be available for urgent issues outside of office hours, we are not available 24/7.   If you have an urgent issue and are unable to reach us, you may choose to seek medical care at your doctor's office, retail clinic, urgent care center, or emergency room.  If you have a medical emergency, please immediately call 911 or go to the emergency department.  Pager Numbers  - Dr. Kowalski: [phone]  - Dr. Moye: [phone]  - Dr. Stewart: [phone]  In the event of inclement weather, please call our main line at [phone] for an update on the status of any delays or closures.  Dermatology Medication Tips: Please keep the boxes that topical medications come in in order to help keep track of the instructions about where and how to use these. Pharmacies typically print the medication instructions only on the boxes and not directly on the medication tubes.   If your medication is too expensive, please contact our office at [phone] option 4 or send us a message through MyChart.   We are  unable to tell what your co-pay for medications will be in advance as this is different depending on your insurance   coverage. However, we may be able to find a substitute medication at lower cost or fill out paperwork to get insurance to cover a needed medication.   If a prior authorization is required to get your medication covered by your insurance company, please allow us 1-2 business days to complete this process.  Drug prices often vary depending on where the prescription is filled and some pharmacies may offer cheaper prices.  The website www.goodrx.com contains coupons for medications through different pharmacies. The prices here do not account for what the cost may be with help from insurance (it may be cheaper with your insurance), but the website can give you the price if you did not use any insurance.  - You can print the associated coupon and take it with your prescription to the pharmacy.  - You may also stop by our office during regular business hours and pick up a GoodRx coupon card.  - If you need your prescription sent electronically to a different pharmacy, notify our office through [facility] MyChart or by phone at [phone] option 4.     Si Usted Necesita Algo Despus de Su Visita  Tambin puede enviarnos un mensaje a travs de MyChart. Por lo general respondemos a los mensajes de MyChart en el transcurso de 1 a 2 das hbiles.  Para renovar recetas, por favor pida a su farmacia que se ponga en contacto con nuestra oficina. Nuestro nmero de fax es el [phone].  Si tiene un asunto urgente cuando la clnica est cerrada y que no puede esperar hasta el siguiente da hbil, puede llamar/localizar a su doctor(a) al nmero que aparece a continuacin.   Por favor, tenga en cuenta que aunque hacemos todo lo posible para estar disponibles para asuntos urgentes fuera del horario de oficina, no estamos disponibles las 24 horas del da, los 7 das de la semana.   Si tiene un  problema urgente y no puede comunicarse con nosotros, puede optar por buscar atencin mdica  en el consultorio de su doctor(a), en una clnica privada, en un centro de atencin urgente o en una sala de emergencias.  Si tiene una emergencia mdica, por favor llame inmediatamente al 911 o vaya a la sala de emergencias.  Nmeros de bper  - Dr. Kowalski: [phone]  - Dra. Moye: [phone]  - Dra. Stewart: [phone]  En caso de inclemencias del tiempo, por favor llame a nuestra lnea principal al [phone] para una actualizacin sobre el estado de cualquier retraso o cierre.  Consejos para la medicacin en dermatologa: Por favor, guarde las cajas en las que vienen los medicamentos de uso tpico para ayudarle a seguir las instrucciones sobre dnde y cmo usarlos. Las farmacias generalmente imprimen las instrucciones del medicamento slo en las cajas y no directamente en los tubos del medicamento.   Si su medicamento es muy caro, por favor, pngase en contacto con nuestra oficina llamando al [phone] y presione la opcin 4 o envenos un mensaje a travs de MyChart.   No podemos decirle cul ser su copago por los medicamentos por adelantado ya que esto es diferente dependiendo de la cobertura de su seguro. Sin embargo, es posible que podamos encontrar un medicamento sustituto a menor costo o llenar un formulario para que el seguro cubra el medicamento que se considera necesario.   Si se requiere una autorizacin previa para que su compaa de seguros cubra su medicamento, por favor permtanos de 1 a 2 das hbiles para completar este proceso.  Los precios de   los medicamentos varan con frecuencia dependiendo del lugar de dnde se surte la receta y alguna farmacias pueden ofrecer precios ms baratos.  El sitio web www.goodrx.com tiene cupones para medicamentos de diferentes farmacias. Los precios aqu no tienen en cuenta lo que podra costar con la ayuda del seguro (puede ser ms  barato con su seguro), pero el sitio web puede darle el precio si no utiliz ningn seguro.  - Puede imprimir el cupn correspondiente y llevarlo con su receta a la farmacia.  - Tambin puede pasar por nuestra oficina durante el horario de atencin regular y recoger una tarjeta de cupones de GoodRx.  - Si necesita que su receta se enve electrnicamente a una farmacia diferente, informe a nuestra oficina a travs de MyChart de [facility] o por telfono llamando al [phone] y presione la opcin 4.  

## 2022-07-25 ENCOUNTER — Encounter: Payer: Self-pay | Admitting: Dermatology

## 2022-07-27 ENCOUNTER — Telehealth: Payer: Self-pay

## 2022-07-27 NOTE — Telephone Encounter (Signed)
Patient advised of BX results .aw 

## 2022-07-27 NOTE — Telephone Encounter (Signed)
LM on VM please return my call  

## 2022-07-27 NOTE — Telephone Encounter (Signed)
-----   Message from Deirdre Evener, MD sent at 07/27/2022 12:43 PM EDT ----- Diagnosis Skin , right ear inferior helix BASAL CELL CARCINOMA, SUPERFICIAL AND NODULAR PATTERNS  Cancer = BCC Already treated Recheck next visit

## 2022-09-06 ENCOUNTER — Encounter: Payer: Self-pay | Admitting: Dermatology

## 2022-09-06 ENCOUNTER — Ambulatory Visit: Payer: Medicare HMO | Admitting: Dermatology

## 2022-09-06 VITALS — BP 102/76

## 2022-09-06 DIAGNOSIS — Z85828 Personal history of other malignant neoplasm of skin: Secondary | ICD-10-CM

## 2022-09-06 DIAGNOSIS — D492 Neoplasm of unspecified behavior of bone, soft tissue, and skin: Secondary | ICD-10-CM

## 2022-09-06 DIAGNOSIS — L578 Other skin changes due to chronic exposure to nonionizing radiation: Secondary | ICD-10-CM | POA: Diagnosis not present

## 2022-09-06 DIAGNOSIS — C4401 Basal cell carcinoma of skin of lip: Secondary | ICD-10-CM

## 2022-09-06 DIAGNOSIS — W908XXA Exposure to other nonionizing radiation, initial encounter: Secondary | ICD-10-CM | POA: Diagnosis not present

## 2022-09-06 NOTE — Progress Notes (Signed)
   Follow-Up Visit   Subjective  Dean Gonzales is a 67 y.o. male who presents for the following: recheck crust L upper lip, Recheck BCC R ear infer helix, bx and EDC 07/19/22 The patient has spots, moles and lesions to be evaluated, some may be new or changing and the patient may have concern these could be cancer.  The following portions of the chart were reviewed this encounter and updated as appropriate: medications, allergies, medical history  Review of Systems:  No other skin or systemic complaints except as noted in HPI or Assessment and Plan.  Objective  Well appearing patient in no apparent distress; mood and affect are within normal limits. A focused examination was performed of the following areas: Face, ears Relevant exam findings are noted in the Assessment and Plan.  L upper lip 1.1 x 0.4cm crusted ulcer      Assessment & Plan   HISTORY OF BASAL CELL CARCINOMA OF THE SKIN - No evidence of recurrence today - Recommend regular full body skin exams - Recommend daily broad spectrum sunscreen SPF 30+ to sun-exposed areas, reapply every 2 hours as needed.  - Call if any new or changing lesions are noted between office visits  - R ear inferior helix  Neoplasm of skin L upper lip  Skin / nail biopsy Type of biopsy: tangential   Informed consent: discussed and consent obtained   Timeout: patient name, date of birth, surgical site, and procedure verified   Procedure prep:  Patient was prepped and draped in usual sterile fashion Prep type:  Isopropyl alcohol Anesthesia: the lesion was anesthetized in a standard fashion   Anesthetic:  1% lidocaine w/ epinephrine 1-100,000 buffered w/ 8.4% NaHCO3 Instrument used: flexible razor blade   Outcome: patient tolerated procedure well   Post-procedure details: sterile dressing applied and wound care instructions given   Dressing type: bandage and bacitracin    Specimen 1 - Surgical pathology Differential Diagnosis: D48.5  R/O BCC  Check Margins: No 1.1 x 0.4cm crusted ulcer Small specimen  Recommend mohs at [facility] if skin cancer  ACTINIC DAMAGE - chronic, secondary to cumulative UV radiation exposure/sun exposure over time - diffuse scaly erythematous macules with underlying dyspigmentation - Recommend daily broad spectrum sunscreen SPF 30+ to sun-exposed areas, reapply every 2 hours as needed.  - Recommend staying in the shade or wearing long sleeves, sun glasses (UVA+UVB protection) and wide brim hats (4-inch brim around the entire circumference of the hat). - Call for new or changing lesions.   Return in about 6 months (around 03/08/2023) for UBSE, Hx of BCC, Hx of Dysplastic nevi.  I, Ardis Rowan, RMA, am acting as scribe for Armida Sans, MD .  Documentation: I have reviewed the above documentation for accuracy and completeness, and I agree with the above.  Armida Sans, MD

## 2022-09-06 NOTE — Patient Instructions (Addendum)
Wound Care Instructions  Cleanse wound gently with soap and water once a day then pat dry with clean gauze. Apply a thin coat of Petrolatum (petroleum jelly, "Vaseline") over the wound (unless you have an allergy to this). We recommend that you use a new, sterile tube of Vaseline. Do not pick or remove scabs. Do not remove the yellow or white "healing tissue" from the base of the wound.  Cover the wound with fresh, clean, nonstick gauze and secure with paper tape. You may use Band-Aids in place of gauze and tape if the wound is small enough, but would recommend trimming much of the tape off as there is often too much. Sometimes Band-Aids can irritate the skin.  You should call the office for your biopsy report after 1 week if you have not already been contacted.  If you experience any problems, such as abnormal amounts of bleeding, swelling, significant bruising, significant pain, or evidence of infection, please call the office immediately.  FOR ADULT SURGERY PATIENTS: If you need something for pain relief you may take 1 extra strength Tylenol (acetaminophen) AND 2 Ibuprofen (200mg each) together every 4 hours as needed for pain. (do not take these if you are allergic to them or if you have a reason you should not take them.) Typically, you may only need pain medication for 1 to 3 days.     Due to recent changes in healthcare laws, you may see results of your pathology and/or laboratory studies on MyChart before the doctors have had a chance to review them. We understand that in some cases there may be results that are confusing or concerning to you. Please understand that not all results are received at the same time and often the doctors may need to interpret multiple results in order to provide you with the best plan of care or course of treatment. Therefore, we ask that you please give us 2 business days to thoroughly review all your results before contacting the office for clarification. Should  we see a critical lab result, you will be contacted sooner.   If You Need Anything After Your Visit  If you have any questions or concerns for your doctor, please call our main line at [phone] and press option 4 to reach your doctor's medical assistant. If no one answers, please leave a voicemail as directed and we will return your call as soon as possible. Messages left after 4 pm will be answered the following business day.   You may also send us a message via MyChart. We typically respond to MyChart messages within 1-2 business days.  For prescription refills, please ask your pharmacy to contact our office. Our fax number is [phone].  If you have an urgent issue when the clinic is closed that cannot wait until the next business day, you can page your doctor at the number below.    Please note that while we do our best to be available for urgent issues outside of office hours, we are not available 24/7.   If you have an urgent issue and are unable to reach us, you may choose to seek medical care at your doctor's office, retail clinic, urgent care center, or emergency room.  If you have a medical emergency, please immediately call 911 or go to the emergency department.  Pager Numbers  - Dr. Kowalski: [phone]  - Dr. Moye: [phone]  - Dr. Stewart: [phone]  In the event of inclement weather, please call our main line at   [phone] for an update on the status of any delays or closures.  Dermatology Medication Tips: Please keep the boxes that topical medications come in in order to help keep track of the instructions about where and how to use these. Pharmacies typically print the medication instructions only on the boxes and not directly on the medication tubes.   If your medication is too expensive, please contact our office at [phone] option 4 or send us a message through MyChart.   We are unable to tell what your co-pay for medications will be in  advance as this is different depending on your insurance coverage. However, we may be able to find a substitute medication at lower cost or fill out paperwork to get insurance to cover a needed medication.   If a prior authorization is required to get your medication covered by your insurance company, please allow us 1-2 business days to complete this process.  Drug prices often vary depending on where the prescription is filled and some pharmacies may offer cheaper prices.  The website www.goodrx.com contains coupons for medications through different pharmacies. The prices here do not account for what the cost may be with help from insurance (it may be cheaper with your insurance), but the website can give you the price if you did not use any insurance.  - You can print the associated coupon and take it with your prescription to the pharmacy.  - You may also stop by our office during regular business hours and pick up a GoodRx coupon card.  - If you need your prescription sent electronically to a different pharmacy, notify our office through [facility] MyChart or by phone at [phone] option 4.     Si Usted Necesita Algo Despus de Su Visita  Tambin puede enviarnos un mensaje a travs de MyChart. Por lo general respondemos a los mensajes de MyChart en el transcurso de 1 a 2 das hbiles.  Para renovar recetas, por favor pida a su farmacia que se ponga en contacto con nuestra oficina. Nuestro nmero de fax es el [phone].  Si tiene un asunto urgente cuando la clnica est cerrada y que no puede esperar hasta el siguiente da hbil, puede llamar/localizar a su doctor(a) al nmero que aparece a continuacin.   Por favor, tenga en cuenta que aunque hacemos todo lo posible para estar disponibles para asuntos urgentes fuera del horario de oficina, no estamos disponibles las 24 horas del da, los 7 das de la semana.   Si tiene un problema urgente y no puede comunicarse con nosotros, puede  optar por buscar atencin mdica  en el consultorio de su doctor(a), en una clnica privada, en un centro de atencin urgente o en una sala de emergencias.  Si tiene una emergencia mdica, por favor llame inmediatamente al 911 o vaya a la sala de emergencias.  Nmeros de bper  - Dr. Kowalski: [phone]  - Dra. Moye: [phone]  - Dra. Stewart: [phone]  En caso de inclemencias del tiempo, por favor llame a nuestra lnea principal al [phone] para una actualizacin sobre el estado de cualquier retraso o cierre.  Consejos para la medicacin en dermatologa: Por favor, guarde las cajas en las que vienen los medicamentos de uso tpico para ayudarle a seguir las instrucciones sobre dnde y cmo usarlos. Las farmacias generalmente imprimen las instrucciones del medicamento slo en las cajas y no directamente en los tubos del medicamento.   Si su medicamento es muy caro, por favor, pngase en contacto con   nuestra oficina llamando al [phone] y presione la opcin 4 o envenos un mensaje a travs de MyChart.   No podemos decirle cul ser su copago por los medicamentos por adelantado ya que esto es diferente dependiendo de la cobertura de su seguro. Sin embargo, es posible que podamos encontrar un medicamento sustituto a menor costo o llenar un formulario para que el seguro cubra el medicamento que se considera necesario.   Si se requiere una autorizacin previa para que su compaa de seguros cubra su medicamento, por favor permtanos de 1 a 2 das hbiles para completar este proceso.  Los precios de los medicamentos varan con frecuencia dependiendo del lugar de dnde se surte la receta y alguna farmacias pueden ofrecer precios ms baratos.  El sitio web www.goodrx.com tiene cupones para medicamentos de diferentes farmacias. Los precios aqu no tienen en cuenta lo que podra costar con la ayuda del seguro (puede ser ms barato con su seguro), pero el sitio web puede darle el  precio si no utiliz ningn seguro.  - Puede imprimir el cupn correspondiente y llevarlo con su receta a la farmacia.  - Tambin puede pasar por nuestra oficina durante el horario de atencin regular y recoger una tarjeta de cupones de GoodRx.  - Si necesita que su receta se enve electrnicamente a una farmacia diferente, informe a nuestra oficina a travs de MyChart de  o por telfono llamando al [phone] y presione la opcin 4.  

## 2022-09-08 ENCOUNTER — Encounter: Payer: Self-pay | Admitting: Dermatology

## 2022-09-19 ENCOUNTER — Telehealth: Payer: Self-pay

## 2022-09-19 DIAGNOSIS — C4401 Basal cell carcinoma of skin of lip: Secondary | ICD-10-CM

## 2022-09-19 NOTE — Telephone Encounter (Signed)
-----   Message from Deirdre Evener, MD sent at 09/19/2022  1:40 PM EDT ----- Diagnosis Skin , left upper lip BASAL CELL CARCINOMA, INFILTRATIVE PATTERN  Cancer = BCC Schedule MOHS ([facility])

## 2022-09-19 NOTE — Telephone Encounter (Signed)
Patient informed of pathology results. Will fax referral to Dr. Hilbert Odor office at [facility] for Mohs.

## 2022-09-20 ENCOUNTER — Telehealth: Payer: Self-pay

## 2022-09-20 NOTE — Telephone Encounter (Signed)
Emailed referral to Dr. Patsey Berthold office for Mohs.

## 2022-10-31 ENCOUNTER — Telehealth: Payer: Self-pay

## 2022-10-31 NOTE — Telephone Encounter (Signed)
Updating history and specimen tracking from [facility] progress notes of left upper lip. aw

## 2023-02-28 ENCOUNTER — Ambulatory Visit: Payer: Medicare HMO | Admitting: Dermatology

## 2023-02-28 DIAGNOSIS — L820 Inflamed seborrheic keratosis: Secondary | ICD-10-CM | POA: Diagnosis not present

## 2023-02-28 DIAGNOSIS — Z1283 Encounter for screening for malignant neoplasm of skin: Secondary | ICD-10-CM | POA: Diagnosis not present

## 2023-02-28 DIAGNOSIS — D229 Melanocytic nevi, unspecified: Secondary | ICD-10-CM

## 2023-02-28 DIAGNOSIS — D1801 Hemangioma of skin and subcutaneous tissue: Secondary | ICD-10-CM

## 2023-02-28 DIAGNOSIS — L814 Other melanin hyperpigmentation: Secondary | ICD-10-CM

## 2023-02-28 DIAGNOSIS — W908XXA Exposure to other nonionizing radiation, initial encounter: Secondary | ICD-10-CM | POA: Diagnosis not present

## 2023-02-28 DIAGNOSIS — Z85828 Personal history of other malignant neoplasm of skin: Secondary | ICD-10-CM

## 2023-02-28 DIAGNOSIS — L821 Other seborrheic keratosis: Secondary | ICD-10-CM

## 2023-02-28 DIAGNOSIS — L578 Other skin changes due to chronic exposure to nonionizing radiation: Secondary | ICD-10-CM | POA: Diagnosis not present

## 2023-02-28 DIAGNOSIS — Z86018 Personal history of other benign neoplasm: Secondary | ICD-10-CM

## 2023-02-28 DIAGNOSIS — L570 Actinic keratosis: Secondary | ICD-10-CM | POA: Diagnosis not present

## 2023-02-28 NOTE — Patient Instructions (Signed)

## 2023-02-28 NOTE — Progress Notes (Signed)
Follow-Up Visit   Subjective  Dean Gonzales is a 67 y.o. male who presents for the following: Skin Cancer Screening and Upper Body Skin Exam  The patient presents for Upper Body Skin Exam (UBSE) for skin cancer screening and mole check. The patient has spots, moles and lesions to be evaluated, some may be new or changing and the patient may have concern these could be cancer. He has a new crusted spot on his left shoulder. He has a history of BCCs, most recent left upper lip tx with Mohs 10/2022. History of Dysplastic nevi.  The following portions of the chart were reviewed this encounter and updated as appropriate: medications, allergies, medical history  Review of Systems:  No other skin or systemic complaints except as noted in HPI or Assessment and Plan.  Objective  Well appearing patient in no apparent distress; mood and affect are within normal limits.  All skin waist up examined. Relevant physical exam findings are noted in the Assessment and Plan.  nose x 2 (2) Erythematous thin papules/macules with gritty scale.  Left Shoulder x 1 Erythematous stuck-on, waxy papule or plaque  Assessment & Plan   AK (ACTINIC KERATOSIS) (2) nose x 2 (2) Actinic keratoses are precancerous spots that appear secondary to cumulative UV radiation exposure/sun exposure over time. They are chronic with expected duration over 1 year. A portion of actinic keratoses will progress to squamous cell carcinoma of the skin. It is not possible to reliably predict which spots will progress to skin cancer and so treatment is recommended to prevent development of skin cancer.  Recommend daily broad spectrum sunscreen SPF 30+ to sun-exposed areas, reapply every 2 hours as needed.  Recommend staying in the shade or wearing long sleeves, sun glasses (UVA+UVB protection) and wide brim hats (4-inch brim around the entire circumference of the hat). Call for new or changing lesions. Destruction of lesion - nose x 2  (2) Complexity: simple   Destruction method: cryotherapy   Informed consent: discussed and consent obtained   Timeout:  patient name, date of birth, surgical site, and procedure verified Lesion destroyed using liquid nitrogen: Yes   Region frozen until ice ball extended beyond lesion: Yes   Outcome: patient tolerated procedure well with no complications   Post-procedure details: wound care instructions given   INFLAMED SEBORRHEIC KERATOSIS Left Shoulder x 1 Symptomatic, irritating, patient would like treated. Destruction of lesion - Left Shoulder x 1 Complexity: simple   Destruction method: cryotherapy   Informed consent: discussed and consent obtained   Timeout:  patient name, date of birth, surgical site, and procedure verified Lesion destroyed using liquid nitrogen: Yes   Region frozen until ice ball extended beyond lesion: Yes   Outcome: patient tolerated procedure well with no complications   Post-procedure details: wound care instructions given   SKIN CANCER SCREENING   ACTINIC SKIN DAMAGE   LENTIGO   MELANOCYTIC NEVUS, UNSPECIFIED LOCATION   HISTORY OF DYSPLASTIC NEVUS   HISTORY OF BASAL CELL CARCINOMA   Skin cancer screening performed today.  Actinic Damage - Chronic condition, secondary to cumulative UV/sun exposure - diffuse scaly erythematous macules with underlying dyspigmentation - Recommend daily broad spectrum sunscreen SPF 30+ to sun-exposed areas, reapply every 2 hours as needed.  - Staying in the shade or wearing long sleeves, sun glasses (UVA+UVB protection) and wide brim hats (4-inch brim around the entire circumference of the hat) are also recommended for sun protection.  - Call for new or changing lesions.  Lentigines, Seborrheic Keratoses, Hemangiomas - Benign normal skin lesions - Benign-appearing - Call for any changes  Melanocytic Nevi - Tan-brown and/or pink-flesh-colored symmetric macules and papules - Benign appearing on exam today -  Observation - Call clinic for new or changing moles - Recommend daily use of broad spectrum spf 30+ sunscreen to sun-exposed areas.   HISTORY OF BASAL CELL CARCINOMA OF THE SKIN Left upper lip, Mohs 10/20/2022 Right ear inf helix, [facility] 07/19/2022 Left anterior shoulder inf to lat clavicle, exc 2009 - No evidence of recurrence today - Recommend regular full body skin exams - Recommend daily broad spectrum sunscreen SPF 30+ to sun-exposed areas, reapply every 2 hours as needed.  - Call if any new or changing lesions are noted between office visits  History of Dysplastic Nevi - No evidence of recurrence today - Recommend regular full body skin exams - Recommend daily broad spectrum sunscreen SPF 30+ to sun-exposed areas, reapply every 2 hours as needed.  - Call if any new or changing lesions are noted between office visits   Return 6-8 months, for AKs, Hx BCC.  Documentation: I have reviewed the above documentation for accuracy and completeness, and I agree with the above.  Armida Sans, MD

## 2023-03-11 ENCOUNTER — Encounter: Payer: Self-pay | Admitting: Dermatology

## 2023-08-29 ENCOUNTER — Ambulatory Visit: Payer: Medicare HMO | Admitting: Dermatology

## 2023-08-29 ENCOUNTER — Encounter: Payer: Self-pay | Admitting: Dermatology

## 2023-08-29 DIAGNOSIS — Z1283 Encounter for screening for malignant neoplasm of skin: Secondary | ICD-10-CM

## 2023-08-29 DIAGNOSIS — L820 Inflamed seborrheic keratosis: Secondary | ICD-10-CM

## 2023-08-29 DIAGNOSIS — Z872 Personal history of diseases of the skin and subcutaneous tissue: Secondary | ICD-10-CM

## 2023-08-29 DIAGNOSIS — Z86018 Personal history of other benign neoplasm: Secondary | ICD-10-CM

## 2023-08-29 DIAGNOSIS — W908XXA Exposure to other nonionizing radiation, initial encounter: Secondary | ICD-10-CM

## 2023-08-29 DIAGNOSIS — Z7189 Other specified counseling: Secondary | ICD-10-CM

## 2023-08-29 DIAGNOSIS — L578 Other skin changes due to chronic exposure to nonionizing radiation: Secondary | ICD-10-CM | POA: Diagnosis not present

## 2023-08-29 DIAGNOSIS — L821 Other seborrheic keratosis: Secondary | ICD-10-CM

## 2023-08-29 DIAGNOSIS — B353 Tinea pedis: Secondary | ICD-10-CM

## 2023-08-29 DIAGNOSIS — L814 Other melanin hyperpigmentation: Secondary | ICD-10-CM | POA: Diagnosis not present

## 2023-08-29 DIAGNOSIS — Z79899 Other long term (current) drug therapy: Secondary | ICD-10-CM

## 2023-08-29 DIAGNOSIS — D229 Melanocytic nevi, unspecified: Secondary | ICD-10-CM

## 2023-08-29 DIAGNOSIS — Z85828 Personal history of other malignant neoplasm of skin: Secondary | ICD-10-CM

## 2023-08-29 DIAGNOSIS — D1801 Hemangioma of skin and subcutaneous tissue: Secondary | ICD-10-CM

## 2023-08-29 NOTE — Progress Notes (Signed)
 Follow-Up Visit   Subjective  Dean Gonzales is a 68 y.o. male who presents for the following: Skin Cancer Screening and Upper Body Skin Exam. Hx of BCCs. Hx of AKs. Hx of dysplastic nevi  6 month AK follow up. Nose. Tx with LN2 at last visit. Has spot on right shoulder. Raised, itching, irritated.   The patient presents for Upper Body Skin Exam (UBSE) for skin cancer screening and mole check. The patient has spots, moles and lesions to be evaluated, some may be new or changing and the patient may have concern these could be cancer.  The following portions of the chart were reviewed this encounter and updated as appropriate: medications, allergies, medical history  Review of Systems:  No other skin or systemic complaints except as noted in HPI or Assessment and Plan.  Objective  Well appearing patient in no apparent distress; mood and affect are within normal limits.  All skin waist up examined. Relevant physical exam findings are noted in the Assessment and Plan.  Right Shoulder - Posterior x1, L cheek x1, R forehead x1, L elbow area x2 (5) Erythematous keratotic or waxy stuck-on papule or plaque. Nose Clear today  Assessment & Plan   INFLAMED SEBORRHEIC KERATOSIS (5) Right Shoulder - Posterior x1, L cheek x1, R forehead x1, L elbow area x2 (5) Symptomatic, irritating, patient would like treated. Destruction of lesion - Right Shoulder - Posterior x1, L cheek x1, R forehead x1, L elbow area x2 (5) Complexity: simple   Destruction method: cryotherapy   Informed consent: discussed and consent obtained   Timeout:  patient name, date of birth, surgical site, and procedure verified Lesion destroyed using liquid nitrogen: Yes   Region frozen until ice ball extended beyond lesion: Yes   Outcome: patient tolerated procedure well with no complications   Post-procedure details: wound care instructions given   Additional details:  Prior to procedure, discussed risks of blister  formation, small wound, skin dyspigmentation, or rare scar following cryotherapy. Recommend Vaseline ointment to treated areas while healing.  HISTORY OF ACTINIC KERATOSIS Nose Recommend daily broad spectrum sunscreen SPF 30+ to sun-exposed areas, reapply every 2 hours as needed. Call for new or changing lesions.  Staying in the shade or wearing long sleeves, sun glasses (UVA+UVB protection) and wide brim hats (4-inch brim around the entire circumference of the hat) are also recommended for sun protection.   Watch for recurrence.  ACTINIC SKIN DAMAGE   SKIN CANCER SCREENING   HISTORY OF BASAL CELL CARCINOMA   LENTIGO   MELANOCYTIC NEVUS, UNSPECIFIED LOCATION   TINEA PEDIS OF BOTH FEET    Skin cancer screening performed today.  HISTORY OF BASAL CELL CARCINOMA OF THE SKIN - No evidence of recurrence today - Recommend regular full body skin exams - Recommend daily broad spectrum sunscreen SPF 30+ to sun-exposed areas, reapply every 2 hours as needed.  - Call if any new or changing lesions are noted between office visits  Actinic Damage - Chronic condition, secondary to cumulative UV/sun exposure - diffuse scaly erythematous macules with underlying dyspigmentation - Recommend daily broad spectrum sunscreen SPF 30+ to sun-exposed areas, reapply every 2 hours as needed.  - Staying in the shade or wearing long sleeves, sun glasses (UVA+UVB protection) and wide brim hats (4-inch brim around the entire circumference of the hat) are also recommended for sun protection.  - Call for new or changing lesions.  Lentigines, Seborrheic Keratoses, Hemangiomas - Benign normal skin lesions - Benign-appearing - Call  for any changes  Melanocytic Nevi - Tan-brown and/or pink-flesh-colored symmetric macules and papules - Benign appearing on exam today - Observation - Call clinic for new or changing moles - Recommend daily use of broad spectrum spf 30+ sunscreen to sun-exposed areas.    TINEA PEDIS Exam: Scaling and maceration web spaces and over distal and lateral soles. Chronic and persistent condition with duration or expected duration over one year. Condition is symptomatic / bothersome to patient. Not to goal. Treatment Plan: Use Ketoconazole  cream to feet and between toes every night at bedtime Pt declined oral treatment today with oral antifungals.  HEMANGIOMA, irritated/inflamed secondary to hair cut Exam: red papule at right temple within hair line. Discussed benign nature. Recommend observation. Call for changes. RTC if not improved in 6-8 weeks.  Hx of spitting suture at [facility] site, upper lip. S/P Mohs surgery 10/27/2022. Clear today.  Return in about 9 months (around 05/28/2024) for TBSE, HxBCC, HxAKs.  I, Jill Parcell, CMA, am acting as scribe for Celine Collard, MD.   Documentation: I have reviewed the above documentation for accuracy and completeness, and I agree with the above.  Celine Collard, MD

## 2023-08-29 NOTE — Progress Notes (Deleted)
   Follow-Up Visit   Subjective  Dean Gonzales is a 68 y.o. male who presents for the following: *** The patient has spots, moles and lesions to be evaluated, some may be new or changing and the patient may have concern these could be cancer.   The following portions of the chart were reviewed this encounter and updated as appropriate: medications, allergies, medical history  Review of Systems:  No other skin or systemic complaints except as noted in HPI or Assessment and Plan.  Objective  Well appearing patient in no apparent distress; mood and affect are within normal limits.  ***A full examination was performed including scalp, head, eyes, ears, nose, lips, neck, chest, axillae, abdomen, back, buttocks, bilateral upper extremities, bilateral lower extremities, hands, feet, fingers, toes, fingernails, and toenails. All findings within normal limits unless otherwise noted below.  ***A focused examination was performed of the following areas: ***  Relevant exam findings are noted in the Assessment and Plan.    Assessment & Plan       No follow-ups on file.  I, Adaja Wander, CMA, am acting as scribe for Celine Collard, MD.   Documentation: I have reviewed the above documentation for accuracy and completeness, and I agree with the above.  Celine Collard, MD

## 2023-08-29 NOTE — Patient Instructions (Signed)
 Use Ketoconazole  cream to feet and between toes every night at bedtime     Cryotherapy Aftercare  Wash gently with soap and water everyday.   Apply Vaseline and Band-Aid daily until healed.     Recommend daily broad spectrum sunscreen SPF 30+ to sun-exposed areas, reapply every 2 hours as needed. Call for new or changing lesions.  Staying in the shade or wearing long sleeves, sun glasses (UVA+UVB protection) and wide brim hats (4-inch brim around the entire circumference of the hat) are also recommended for sun protection.      Melanoma ABCDEs  Melanoma is the most dangerous type of skin cancer, and is the leading cause of death from skin disease.  You are more likely to develop melanoma if you: Have light-colored skin, light-colored eyes, or red or blond hair Spend a lot of time in the sun Tan regularly, either outdoors or in a tanning bed Have had blistering sunburns, especially during childhood Have a close family member who has had a melanoma Have atypical moles or large birthmarks  Early detection of melanoma is key since treatment is typically straightforward and cure rates are extremely high if we catch it early.   The first sign of melanoma is often a change in a mole or a new dark spot.  The ABCDE system is a way of remembering the signs of melanoma.  A for asymmetry:  The two halves do not match. B for border:  The edges of the growth are irregular. C for color:  A mixture of colors are present instead of an even brown color. D for diameter:  Melanomas are usually (but not always) greater than 6mm - the size of a pencil eraser. E for evolution:  The spot keeps changing in size, shape, and color.  Please check your skin once per month between visits. You can use a small mirror in front and a large mirror behind you to keep an eye on the back side or your body.   If you see any new or changing lesions before your next follow-up, please call to schedule a  visit.  Please continue daily skin protection including broad spectrum sunscreen SPF 30+ to sun-exposed areas, reapplying every 2 hours as needed when you're outdoors.   Staying in the shade or wearing long sleeves, sun glasses (UVA+UVB protection) and wide brim hats (4-inch brim around the entire circumference of the hat) are also recommended for sun protection.      Due to recent changes in healthcare laws, you may see results of your pathology and/or laboratory studies on MyChart before the doctors have had a chance to review them. We understand that in some cases there may be results that are confusing or concerning to you. Please understand that not all results are received at the same time and often the doctors may need to interpret multiple results in order to provide you with the best plan of care or course of treatment. Therefore, we ask that you please give us  2 business days to thoroughly review all your results before contacting the office for clarification. Should we see a critical lab result, you will be contacted sooner.   If You Need Anything After Your Visit  If you have any questions or concerns for your doctor, please call our main line at [phone] and press option 4 to reach your doctor's medical assistant. If no one answers, please leave a voicemail as directed and we will return your call as soon as possible. Messages  left after 4 pm will be answered the following business day.   You may also send us  a message via MyChart. We typically respond to MyChart messages within 1-2 business days.  For prescription refills, please ask your pharmacy to contact our office. Our fax number is [phone].  If you have an urgent issue when the clinic is closed that cannot wait until the next business day, you can page your doctor at the number below.    Please note that while we do our best to be available for urgent issues outside of office hours, we are not available 24/7.   If  you have an urgent issue and are unable to reach us , you may choose to seek medical care at your doctor's office, retail clinic, urgent care center, or emergency room.  If you have a medical emergency, please immediately call 911 or go to the emergency department.  Pager Numbers  - Dr. Bary Likes: [phone]  - Dr. Annette Barters: [phone]  - Dr. Felipe Horton: [phone]   In the event of inclement weather, please call our main line at [phone] for an update on the status of any delays or closures.  Dermatology Medication Tips: Please keep the boxes that topical medications come in in order to help keep track of the instructions about where and how to use these. Pharmacies typically print the medication instructions only on the boxes and not directly on the medication tubes.   If your medication is too expensive, please contact our office at [phone] option 4 or send us  a message through MyChart.   We are unable to tell what your co-pay for medications will be in advance as this is different depending on your insurance coverage. However, we may be able to find a substitute medication at lower cost or fill out paperwork to get insurance to cover a needed medication.   If a prior authorization is required to get your medication covered by your insurance company, please allow us  1-2 business days to complete this process.  Drug prices often vary depending on where the prescription is filled and some pharmacies may offer cheaper prices.  The website www.goodrx.com contains coupons for medications through different pharmacies. The prices here do not account for what the cost may be with help from insurance (it may be cheaper with your insurance), but the website can give you the price if you did not use any insurance.  - You can print the associated coupon and take it with your prescription to the pharmacy.  - You may also stop by our office during regular business hours and pick up a GoodRx  coupon card.  - If you need your prescription sent electronically to a different pharmacy, notify our office through [facility] or by phone at [phone] option 4.     Si Usted Necesita Algo Despus de Su Visita  Tambin puede enviarnos un mensaje a travs de Clinical cytogeneticist. Por lo general respondemos a los mensajes de MyChart en el transcurso de 1 a 2 das hbiles.  Para renovar recetas, por favor pida a su farmacia que se ponga en contacto con nuestra oficina. Franz Jacks de fax es [place] [phone].  Si tiene un asunto urgente cuando la clnica est cerrada y que no puede esperar hasta el siguiente da hbil, puede llamar/localizar a su doctor(a) al nmero que aparece a continuacin.   Por favor, tenga en cuenta que aunque hacemos todo lo posible para estar disponibles para asuntos urgentes fuera del horario de [place], no  estamos disponibles las 24 horas del da, los 7 [address]  [address] de la [place].   Si tiene un problema urgente y no puede comunicarse con nosotros, puede optar por buscar atencin mdica  en el consultorio de su doctor(a), en una clnica privada, en un centro de atencin urgente o en una sala de emergencias.  Si tiene Engineer, drilling, por favor llame inmediatamente al 911 o vaya a la sala de emergencias.  Nmeros de bper  - Dr. Bary Likes: [phone]  - Dra. Annette Barters: [phone]  - Dr. Felipe Horton: [phone]   En caso de inclemencias del tiempo, por favor llame a Lajuan Pila principal al [phone] para una actualizacin sobre el [place] de cualquier retraso o cierre.  Consejos para la medicacin en dermatologa: Por favor, guarde las cajas en las que vienen los medicamentos de uso tpico para ayudarle a seguir las instrucciones sobre dnde y cmo usarlos. Las farmacias generalmente imprimen las instrucciones del medicamento slo en las cajas y no directamente en los tubos del [place].   Si su medicamento es muy caro, por favor, pngase en contacto con  Bettyjane Brunet llamando al [phone] y presione la opcin 4 o envenos un mensaje a travs de Clinical cytogeneticist.   No podemos decirle cul ser su copago por los medicamentos por adelantado ya que esto es diferente dependiendo de la cobertura de su seguro. Sin embargo, es posible que podamos encontrar un medicamento sustituto a Audiological scientist un formulario para que el seguro cubra el medicamento que se considera necesario.   Si se requiere una autorizacin previa para que su compaa de seguros [place] su medicamento, por favor permtanos de 1 a 2 das hbiles para completar este proceso.  Los precios de los medicamentos varan con frecuencia dependiendo del Environmental consultant de dnde se surte la receta y alguna farmacias pueden ofrecer precios ms baratos.  El sitio web www.goodrx.com tiene cupones para medicamentos de Health and safety inspector. Los precios aqu no tienen en cuenta lo que podra costar con la ayuda del seguro (puede ser ms barato con su seguro), pero el sitio web puede darle el precio si no utiliz Tourist information centre manager.  - Puede imprimir el cupn correspondiente y llevarlo con su receta a la farmacia.  - Tambin puede pasar por nuestra oficina durante el horario de atencin regular y Education officer, museum una tarjeta de cupones de GoodRx.  - Si necesita que su receta se enve electrnicamente a una farmacia diferente, informe a nuestra oficina a travs de MyChart de [place] o por telfono llamando al [phone] y presione la opcin 4.

## 2024-05-28 ENCOUNTER — Ambulatory Visit: Admitting: Dermatology
